# Patient Record
Sex: Female | Born: 1935 | Race: White | Hispanic: No | Marital: Married | Smoking: Never smoker
Health system: Southern US, Community
[De-identification: ages and names within clinical notes are randomized; demographics above are authoritative.]

## PROBLEM LIST (undated history)

## (undated) DIAGNOSIS — I639 Cerebral infarction, unspecified: Secondary | ICD-10-CM

## (undated) DIAGNOSIS — F329 Major depressive disorder, single episode, unspecified: Secondary | ICD-10-CM

## (undated) DIAGNOSIS — I10 Essential (primary) hypertension: Secondary | ICD-10-CM

## (undated) DIAGNOSIS — C801 Malignant (primary) neoplasm, unspecified: Secondary | ICD-10-CM

## (undated) DIAGNOSIS — F32A Depression, unspecified: Secondary | ICD-10-CM

## (undated) DIAGNOSIS — F419 Anxiety disorder, unspecified: Secondary | ICD-10-CM

## (undated) DIAGNOSIS — F0390 Unspecified dementia without behavioral disturbance: Secondary | ICD-10-CM

## (undated) DIAGNOSIS — G459 Transient cerebral ischemic attack, unspecified: Secondary | ICD-10-CM

## (undated) DIAGNOSIS — E785 Hyperlipidemia, unspecified: Secondary | ICD-10-CM

## (undated) DIAGNOSIS — M810 Age-related osteoporosis without current pathological fracture: Secondary | ICD-10-CM

## (undated) DIAGNOSIS — W19XXXA Unspecified fall, initial encounter: Secondary | ICD-10-CM

## (undated) HISTORY — PX: ABDOMINAL HYSTERECTOMY: SHX81

---

## 2007-07-08 ENCOUNTER — Inpatient Hospital Stay (HOSPITAL_COMMUNITY): Admission: RE | Admit: 2007-07-08 | Discharge: 2007-07-10 | Payer: Self-pay | Admitting: Neurosurgery

## 2011-04-24 NOTE — Op Note (Signed)
NAMEKRISTEE, ANGUS                ACCOUNT NO.:  [id number]   MEDICAL RECORD NO.:  [id number]          PATIENT TYPE:  INP   LOCATION:  3172                         FACILITY:  [facility]   PHYSICIAN:  Hilda Lias, M.D.   DATE OF BIRTH:  [birth year]   DATE OF PROCEDURE:  07/08/2007  DATE OF DISCHARGE:                               OPERATIVE REPORT   PREOPERATIVE DIAGNOSIS:  Left L4-5 synovial cyst with an L5  radiculopathy.   POSTOPERATIVE DIAGNOSIS:  Left L4-5 synovial cyst with an L5  radiculopathy.   PROCEDURE:  Left L4-5 laminotomy, removal of synovial cyst.  Decompression of the L4 and L5 nerve root.  Microscope.   SURGEON:  Dr. Hilda Lias.   ASSISTANT:  Dr. Hewitt Shorts.   CLINICAL HISTORY:  The patient was seen by me in the office because of  back pain progressing to the left leg with some weakness of the left  foot.  X-rays showed a synovial cyst compromising the thecal sac at the  S1 as well as the L5 nerve root.  The patient want to proceed with  surgery and the risks were explained in the history and physical.   PROCEDURE:  The patient was taken to the OR and he was positioned in a  prone manner.  The back was cleaned with DuraPrep.  A midline incision  from L4-5 was made and muscles were retracted on the left side.  X-rays  showed we were indeed at the level of L4-5.  Then with the microscope we  removed the calcified yellow ligament.  We found that the dural sac was  displaced with synovial cyst, pushing down into the L5 nerve root.  We  went above the yellow ligament and we found normal dura mater and went  below the level of L5.  Then with our dissection, removing the disk  synovial cyst away from the dural sac as well as the L5 nerve root.  At  the end we had good decompression.  The area was irrigated.  The patient  has a good foraminotomy with plenty of room for the L4 and L5 nerve  root.  Then Fentanyl and Depo-Medrol were left in the epidural space  and  the wound was closed with Vicryl and Steri-Strips.           ______________________________  Hilda Lias, M.D.     EB/MEDQ  D:  07/08/2007  T:  07/09/2007  Job:  [id number]

## 2011-09-24 LAB — URINALYSIS, ROUTINE W REFLEX MICROSCOPIC
Glucose, UA: NEGATIVE
Hgb urine dipstick: NEGATIVE
Ketones, ur: 15 — AB
Protein, ur: NEGATIVE

## 2011-09-24 LAB — CBC
Hemoglobin: 13.7
Platelets: 310
RDW: 12.6

## 2011-09-24 LAB — COMPREHENSIVE METABOLIC PANEL
ALT: 27
AST: 20
Albumin: 3.9
Alkaline Phosphatase: 48
GFR calc Af Amer: >60
Glucose, Bld: 100 — ABNORMAL HIGH
Potassium: 4
Sodium: 138
Total Protein: 6.3

## 2014-08-10 DIAGNOSIS — I639 Cerebral infarction, unspecified: Secondary | ICD-10-CM

## 2014-08-10 HISTORY — DX: Cerebral infarction, unspecified: I63.9

## 2015-11-02 ENCOUNTER — Other Ambulatory Visit (HOSPITAL_COMMUNITY): Payer: Medicare Other

## 2015-11-02 ENCOUNTER — Inpatient Hospital Stay (HOSPITAL_COMMUNITY)
Admission: EM | Admit: 2015-11-02 | Discharge: 2015-11-04 | DRG: 065 | Disposition: A | Discharge status: Home Health | Payer: Medicare Other | Attending: Oncology | Admitting: Oncology

## 2015-11-02 ENCOUNTER — Encounter (HOSPITAL_COMMUNITY): Payer: Self-pay | Admitting: Emergency Medicine

## 2015-11-02 ENCOUNTER — Emergency Department (HOSPITAL_COMMUNITY): Payer: Medicare Other

## 2015-11-02 ENCOUNTER — Inpatient Hospital Stay (HOSPITAL_COMMUNITY): Payer: Medicare Other

## 2015-11-02 DIAGNOSIS — R29810 Facial weakness: Secondary | ICD-10-CM | POA: Diagnosis present

## 2015-11-02 DIAGNOSIS — I639 Cerebral infarction, unspecified: Secondary | ICD-10-CM | POA: Diagnosis not present

## 2015-11-02 DIAGNOSIS — K5900 Constipation, unspecified: Secondary | ICD-10-CM | POA: Diagnosis not present

## 2015-11-02 DIAGNOSIS — Z8673 Personal history of transient ischemic attack (TIA), and cerebral infarction without residual deficits: Secondary | ICD-10-CM | POA: Insufficient documentation

## 2015-11-02 DIAGNOSIS — E785 Hyperlipidemia, unspecified: Secondary | ICD-10-CM | POA: Insufficient documentation

## 2015-11-02 DIAGNOSIS — N179 Acute kidney failure, unspecified: Secondary | ICD-10-CM | POA: Diagnosis present

## 2015-11-02 DIAGNOSIS — G8194 Hemiplegia, unspecified affecting left nondominant side: Secondary | ICD-10-CM | POA: Diagnosis present

## 2015-11-02 DIAGNOSIS — Z853 Personal history of malignant neoplasm of breast: Secondary | ICD-10-CM | POA: Diagnosis not present

## 2015-11-02 DIAGNOSIS — I63431 Cerebral infarction due to embolism of right posterior cerebral artery: Principal | ICD-10-CM | POA: Diagnosis present

## 2015-11-02 DIAGNOSIS — R29702 NIHSS score 2: Secondary | ICD-10-CM | POA: Diagnosis present

## 2015-11-02 DIAGNOSIS — G459 Transient cerebral ischemic attack, unspecified: Secondary | ICD-10-CM | POA: Diagnosis present

## 2015-11-02 DIAGNOSIS — R4701 Aphasia: Secondary | ICD-10-CM | POA: Diagnosis present

## 2015-11-02 DIAGNOSIS — R4781 Slurred speech: Secondary | ICD-10-CM | POA: Diagnosis present

## 2015-11-02 DIAGNOSIS — Z7982 Long term (current) use of aspirin: Secondary | ICD-10-CM

## 2015-11-02 DIAGNOSIS — Z90710 Acquired absence of both cervix and uterus: Secondary | ICD-10-CM

## 2015-11-02 DIAGNOSIS — I10 Essential (primary) hypertension: Secondary | ICD-10-CM | POA: Diagnosis present

## 2015-11-02 HISTORY — DX: Cerebral infarction, unspecified: I63.9

## 2015-11-02 HISTORY — DX: Malignant (primary) neoplasm, unspecified: C80.1

## 2015-11-02 LAB — COMPREHENSIVE METABOLIC PANEL
ALBUMIN: 3.6 g/dL (ref 3.5–5.0)
ALT: 22 U/L (ref 14–54)
ANION GAP: 10 (ref 5–15)
AST: 21 U/L (ref 15–41)
Alkaline Phosphatase: 55 U/L (ref 38–126)
BILIRUBIN TOTAL: 0.6 mg/dL (ref 0.3–1.2)
BUN: 29 mg/dL — ABNORMAL HIGH (ref 6–20)
CO2: 23 mmol/L (ref 22–32)
Calcium: 9.2 mg/dL (ref 8.9–10.3)
Chloride: 105 mmol/L (ref 101–111)
Creatinine, Ser: 1.44 mg/dL — ABNORMAL HIGH (ref 0.44–1.00)
GFR calc Af Amer: 37 mL/min — ABNORMAL LOW (ref >60)
GFR calc non Af Amer: 32 mL/min — ABNORMAL LOW (ref >60)
GLUCOSE: 141 mg/dL — AB (ref 65–99)
POTASSIUM: 4.3 mmol/L (ref 3.5–5.1)
SODIUM: 138 mmol/L (ref 135–145)
TOTAL PROTEIN: 6.3 g/dL — AB (ref 6.5–8.1)

## 2015-11-02 LAB — CBC
HCT: 36.2 % (ref 36.0–46.0)
Hemoglobin: 12.1 g/dL (ref 12.0–15.0)
MCH: 31.9 pg (ref 26.0–34.0)
MCHC: 33.4 g/dL (ref 30.0–36.0)
MCV: 95.5 fL (ref 78.0–100.0)
PLATELETS: 163 10*3/uL (ref 150–400)
RBC: 3.79 MIL/uL — ABNORMAL LOW (ref 3.87–5.11)
RDW: 12.7 % (ref 11.5–15.5)
WBC: 6.7 10*3/uL (ref 4.0–10.5)

## 2015-11-02 LAB — DIFFERENTIAL
Basophils Absolute: 0 10*3/uL (ref 0.0–0.1)
Basophils Relative: 0 %
EOS ABS: 0 10*3/uL (ref 0.0–0.7)
EOS PCT: 0 %
Lymphocytes Relative: 26 %
Lymphs Abs: 1.7 10*3/uL (ref 0.7–4.0)
Monocytes Absolute: 0.7 10*3/uL (ref 0.1–1.0)
Monocytes Relative: 10 %
NEUTROS PCT: 64 %
Neutro Abs: 4.3 10*3/uL (ref 1.7–7.7)

## 2015-11-02 LAB — I-STAT TROPONIN, ED: Troponin i, poc: 0.32 ng/mL (ref 0.00–0.08)

## 2015-11-02 LAB — URINALYSIS, ROUTINE W REFLEX MICROSCOPIC
Bilirubin Urine: NEGATIVE
GLUCOSE, UA: NEGATIVE mg/dL
HGB URINE DIPSTICK: NEGATIVE
Ketones, ur: NEGATIVE mg/dL
Leukocytes, UA: NEGATIVE
Nitrite: NEGATIVE
PH: 6 (ref 5.0–8.0)
Protein, ur: NEGATIVE mg/dL
SPECIFIC GRAVITY, URINE: 1.01 (ref 1.005–1.030)

## 2015-11-02 LAB — I-STAT CHEM 8, ED
BUN: 30 mg/dL — ABNORMAL HIGH (ref 6–20)
Calcium, Ion: 1.18 mmol/L (ref 1.13–1.30)
Chloride: 101 mmol/L (ref 101–111)
Creatinine, Ser: 1.4 mg/dL — ABNORMAL HIGH (ref 0.44–1.00)
Glucose, Bld: 139 mg/dL — ABNORMAL HIGH (ref 65–99)
HEMATOCRIT: 37 % (ref 36.0–46.0)
HEMOGLOBIN: 12.6 g/dL (ref 12.0–15.0)
Potassium: 4.2 mmol/L (ref 3.5–5.1)
SODIUM: 137 mmol/L (ref 135–145)
TCO2: 24 mmol/L (ref 0–100)

## 2015-11-02 LAB — ETHANOL: Alcohol, Ethyl (B): <5 mg/dL (ref <5)

## 2015-11-02 LAB — RAPID URINE DRUG SCREEN, HOSP PERFORMED
AMPHETAMINES: NOT DETECTED
Barbiturates: NOT DETECTED
Benzodiazepines: NOT DETECTED
Cocaine: NOT DETECTED
OPIATES: NOT DETECTED
Tetrahydrocannabinol: NOT DETECTED

## 2015-11-02 LAB — PROTIME-INR
INR: 1.06 (ref 0.00–1.49)
PROTHROMBIN TIME: 14 s (ref 11.6–15.2)

## 2015-11-02 LAB — APTT: aPTT: 28 seconds (ref 24–37)

## 2015-11-02 LAB — TROPONIN I
TROPONIN I: 0.55 ng/mL — AB (ref <0.031)
TROPONIN I: 0.48 ng/mL — AB (ref <0.031)
Troponin I: 0.53 ng/mL (ref <0.031)

## 2015-11-02 MED ORDER — BUSPIRONE HCL 10 MG PO TABS
20.0000 mg | ORAL_TABLET | Freq: Two times a day (BID) | ORAL | Status: DC
  Administered 2015-11-02 – 2015-11-04 (×5): 20 mg via ORAL
  Filled 2015-11-02 (×5): qty 2

## 2015-11-02 MED ORDER — SODIUM CHLORIDE 0.9 % IJ SOLN
3.0000 mL | Freq: Two times a day (BID) | INTRAMUSCULAR | Status: DC
  Administered 2015-11-03 (×2): 3 mL via INTRAVENOUS

## 2015-11-02 MED ORDER — PRAVASTATIN SODIUM 20 MG PO TABS
20.0000 mg | ORAL_TABLET | Freq: Every day | ORAL | Status: DC
  Administered 2015-11-02: 20 mg via ORAL
  Filled 2015-11-02: qty 1

## 2015-11-02 MED ORDER — ASPIRIN EC 81 MG PO TBEC
81.0000 mg | DELAYED_RELEASE_TABLET | Freq: Every day | ORAL | Status: DC
  Administered 2015-11-02 – 2015-11-04 (×3): 81 mg via ORAL
  Filled 2015-11-02 (×3): qty 1

## 2015-11-02 MED ORDER — SODIUM CHLORIDE 0.9 % IV SOLN
INTRAVENOUS | Status: AC
  Administered 2015-11-02: 16:00:00 via INTRAVENOUS

## 2015-11-02 MED ORDER — ARIPIPRAZOLE 10 MG PO TABS
5.0000 mg | ORAL_TABLET | Freq: Every day | ORAL | Status: DC
  Administered 2015-11-03 – 2015-11-04 (×2): 5 mg via ORAL
  Filled 2015-11-02 (×2): qty 1

## 2015-11-02 MED ORDER — VENLAFAXINE HCL ER 75 MG PO CP24
150.0000 mg | ORAL_CAPSULE | Freq: Every day | ORAL | Status: DC
  Administered 2015-11-02 – 2015-11-04 (×3): 150 mg via ORAL
  Filled 2015-11-02 (×3): qty 2

## 2015-11-02 MED ORDER — HEPARIN SODIUM (PORCINE) 5000 UNIT/ML IJ SOLN
5000.0000 [IU] | Freq: Three times a day (TID) | INTRAMUSCULAR | Status: DC
Start: 2015-11-02 — End: 2015-11-04
  Administered 2015-11-02 – 2015-11-04 (×6): 5000 [IU] via SUBCUTANEOUS
  Filled 2015-11-02 (×6): qty 1

## 2015-11-02 NOTE — ED Notes (Signed)
Pt attempted bedpan twice. Tolerated in and out cath well.

## 2015-11-02 NOTE — Evaluation (Signed)
Physical Therapy Evaluation Patient Details Name: Nancy Rowland MRN: [id number] DOB: [birth year] Today's Date: 11/02/2015   History of Present Illness  81 y.o. female with a history of hypertension and hyperlipidemia as well as stroke and TIA, brought to the emergency room and code stroke status following the acute onset of speech changes and right-sided weakness. Speech was described as slurred and unintelligible. She had right facial droop and also had right extremity weakness.  Clinical Impression  Pt admitted with above diagnosis. Pt currently with functional limitations due to the deficits listed below (see PT Problem List). PTA Ms. Matthias is alone at home for ~4 hrs/day, otherwise has supervision from aide and daughter, needing assist for dressing.  She was at supervision level of ambulation w/o AD.  However, she demonstrates a festinating gait, shuffling her feet and is at increased risk for falling, requiring min assist at times to steady. Pt will need to have 24/7 assist if she is to return home safely, had discussion about these needs w/ pt and pt's daughter who were very receptive and eager to discuss w/ CM and/or SW.  Pt will benefit from skilled PT to increase their independence and safety with mobility to allow discharge to the venue listed below.      Follow Up Recommendations Home health PT;Supervision/Assistance - 24 hour    Equipment Recommendations  None recommended by PT    Recommendations for Other Services       Precautions / Restrictions Precautions Precautions: Fall Precaution Comments: essential tremors at baseline Restrictions Weight Bearing Restrictions: No      Mobility  Bed Mobility Overal bed mobility: Modified Independent Bed Mobility: Supine to Sit     Supine to sit: Supervision     General bed mobility comments: increased time, elevated head of bed  Transfers Overall transfer level: Needs assistance Equipment used: 1 person hand held  assist;None Transfers: Sit to/from Stand Sit to Stand: Min assist         General transfer comment: Bil LE bracing against bed.  1 person HHA provided during initial sit>stand to assist pt stabilizing.  Close min guard assist provided during stand>sit after ambulation.  Ambulation/Gait Ambulation/Gait assistance: Min assist Ambulation Distance (Feet): 80 Feet Assistive device: 1 person hand held assist;None Gait Pattern/deviations: Step-through pattern;Shuffle;Antalgic;Festinating   Gait velocity interpretation: <1.8 ft/sec, indicative of risk for recurrent falls General Gait Details: Slow festinating gait which daughters report is slower than pt's baseline.  Min assist at times to steady.  Stairs            Wheelchair Mobility    Modified Rankin (Stroke Patients Only) Modified Rankin (Stroke Patients Only) Pre-Morbid Rankin Score: Moderately severe disability Modified Rankin: Moderately severe disability     Balance Overall balance assessment: Needs assistance Sitting-balance support: Feet supported Sitting balance-Leahy Scale: Good     Standing balance support: During functional activity Standing balance-Leahy Scale: Fair Standing balance comment: Min assist to steady at times                             Pertinent Vitals/Pain Pain Assessment: No/denies pain    Home Living Family/patient expects to be discharged to:: Private residence Living Arrangements: Alone Available Help at Discharge: Family;Available PRN/intermittently;Personal care attendant (almost 24/7, see additional comments below) Type of Home: House Home Access: Stairs to enter Entrance Stairs-Rails: None Entrance Stairs-Number of Steps: 1 Home Layout: One level Home Equipment: Walker - 4 wheels Additional Comments:  Pt's two sister present and able to answer questions regarding pt's PLOF and home living situation    Prior Function Level of Independence: Needs assistance   Gait /  Transfers Assistance Needed: no use of AD PTA per pt and pt's daughters  ADL's / Homemaking Assistance Needed: Aide and daughter assist w/ dressing, pt Ind w/ bathing  Comments: Pt has an aide that comes 4x/wk from ~10am-5pm.  The remaining 3 days of the week her daughter stays with her.  Pt's daughter reports the pt is alone for a few hours in the morning and for 1-2 hours at night until the daugther can get there after work to spend the night with pt.     Hand Dominance   Dominant Hand: Right    Extremity/Trunk Assessment   Upper Extremity Assessment: Defer to OT evaluation           Lower Extremity Assessment: RLE deficits/detail RLE Deficits / Details: grossly 4/5    Cervical / Trunk Assessment: Normal  Communication   Communication: Expressive difficulties;HOH (has hearing aids but does not have them with her)  Cognition Arousal/Alertness: Awake/alert Behavior During Therapy: WFL for tasks assessed/performed Overall Cognitive Status: History of cognitive impairments - at baseline Area of Impairment: Orientation;Attention;Memory;Following commands;Safety/judgement;Awareness;Problem solving Orientation Level: Disoriented to;Place (Knows she is in a hospital but cannot name which one) Current Attention Level: Selective Memory: Decreased short-term memory Following Commands: Follows multi-step commands with increased time;Follows multi-step commands consistently Safety/Judgement: Decreased awareness of safety;Decreased awareness of deficits Awareness: Emergent Problem Solving: Slow processing;Requires verbal cues;Requires tactile cues      General Comments General comments (skin integrity, edema, etc.): Discussed w/ pt and family that pt will need 24/7 assist if she is to safely return home.  Pt and family very receptive of this and look forward to speaking w/ CM and/or SW about this.      Exercises        Assessment/Plan    PT Assessment Patient needs continued PT  services  PT Diagnosis Hemiplegia dominant side;Difficulty walking   PT Problem List Decreased strength;Decreased range of motion;Decreased activity tolerance;Decreased balance;Decreased mobility;Decreased cognition;Decreased knowledge of use of DME;Decreased safety awareness;Decreased knowledge of precautions  PT Treatment Interventions DME instruction;Gait training;Stair training;Functional mobility training;Therapeutic activities;Therapeutic exercise;Balance training;Neuromuscular re-education;Cognitive remediation;Patient/family education   PT Goals (Current goals can be found in the Care Plan section) Acute Rehab PT Goals Patient Stated Goal: to decide if it is best to go home or elsewhere for 24/7 assist PT Goal Formulation: With patient/family Time For Goal Achievement: 11/16/15 Potential to Achieve Goals: Good    Frequency Min 4X/week   Barriers to discharge Inaccessible home environment;Decreased caregiver support does not have 24/7 assist currently    Co-evaluation               End of Session Equipment Utilized During Treatment: Gait belt Activity Tolerance: Patient tolerated treatment well Patient left: in bed;with call bell/phone within reach;with family/visitor present;Other (comment) (w/ MD in room. Pt sitting EOB, daughters to stay w/ pt) Nurse Communication: Mobility status;Precautions         Time: [id number] PT Time Calculation (min) (ACUTE ONLY): 19 min   Charges:   PT Evaluation $Initial PT Evaluation Tier I: 1 Procedure     PT G Codes:       Joslyn Hy PT, DPT [phone] Pager: [phone] 11/02/2015, 4:24 PM

## 2015-11-02 NOTE — ED Notes (Signed)
Attempted to call report x2

## 2015-11-02 NOTE — Evaluation (Signed)
Clinical/Bedside Swallow Evaluation Patient Details  Name: Nancy Rowland MRN: [id number] Date of Birth: 12-01-1929  Today's Date: 11/02/2015 Time: SLP Start Time (ACUTE ONLY): 1342 SLP Stop Time (ACUTE ONLY): 1358 SLP Time Calculation (min) (ACUTE ONLY): 16 min  Past Medical History:  Past Medical History  Diagnosis Date  . Cancer ([place])   . Stroke [facility]) 08/2014   Past Surgical History:  Past Surgical History  Procedure Laterality Date  . Abdominal hysterectomy     HPI:  81 y.o. female w/ PMHx of HTN, HLD, previous CVA/TIA, and ?endometrial CA s/p hysterectomy years ago, admitted for TIA/stroke workup.   Assessment / Plan / Recommendation Clinical Impression  Pt presents with functional oropharyngeal swallow marked by adequate mastication, brisk swallow response,  intermittent, mild throat clear with liquids.  Recommend regular diet, thin liquids - will f/u x1 to ensure safety with diet; follow for speech language evaluation next date.      Aspiration Risk  Mild aspiration risk    Diet Recommendation   regular, thin liquids  Medication Administration: Whole meds with puree    Other  Recommendations Oral Care Recommendations: Oral care BID   Follow up Recommendations  None    Frequency and Duration min 1 x/week  1 week       Swallow Study   General Date of Onset: 11/02/15 HPI: 81 y.o. female w/ PMHx of HTN, HLD, previous CVA/TIA, and ?endometrial CA s/p hysterectomy years ago, admitted for TIA/stroke workup. Type of Study: Bedside Swallow Evaluation Previous Swallow Assessment: none per records Diet Prior to this Study: NPO Temperature Spikes Noted: No Respiratory Status: Room air History of Recent Intubation: No Behavior/Cognition: Alert;Cooperative Oral Cavity Assessment: Within Functional Limits Oral Care Completed by SLP: No Oral Cavity - Dentition: Adequate natural dentition Vision: Functional for self-feeding Self-Feeding Abilities: Able to feed  self Patient Positioning: Upright in bed Baseline Vocal Quality: Hoarse Volitional Cough: Strong Volitional Swallow: Able to elicit    Oral/Motor/Sensory Function Overall Oral Motor/Sensory Function: Within functional limits   Ice Chips Ice chips: Within functional limits   Thin Liquid Thin Liquid: Within functional limits (throat-clear x2 post swallow) Presentation: Cup    Nectar Thick Nectar Thick Liquid: Not tested   Honey Thick Honey Thick Liquid: Not tested   Puree Puree: Within functional limits   Solid Solid: Within functional limits       Juan Quam Laurice 11/02/2015,2:02 PM   Estill Bamberg L. Tivis Ringer, [place] CCC/SLP Pager [phone]

## 2015-11-02 NOTE — Progress Notes (Signed)
Patient arrived to [id number]. Patient is oriented x3, disoriented to time. Patient's NIHHS 2 for facial palsy and aphagia. No skin issues noted, per ED RN, patient failed stroke swallow screen, order for slp bedside swallow evaluation placed. Patient oriented to room, staff, unit. Safety measures in place including bed alarm. Family at bedside, will continue to monitor closely.

## 2015-11-02 NOTE — Progress Notes (Signed)
Occupational Therapy Evaluation Patient Details Name: Nancy Rowland MRN: [id number] DOB: [birth year] Today's Date: 11/02/2015    History of Present Illness 81 y.o. female with a history of hypertension and hyperlipidemia as well as stroke and TIA, brought to the emergency room and code stroke status following the acute onset of speech changes and right-sided weakness. Speech was described as slurred and unintelligible. She had right facial droop and also had right extremity weakness.CVA work up underway   Clinical Impression   PTA, pt lived alone and had a caregiver 5 days/wk to assist with IADL tasks. Pt states daughter checks on her daily. Family not present during eval. Pt demonstrates deficits listed below and recommend pt have initial 24/7 S with [place] if D/C plan is home. Will follow acutely to maximize functional level of independence with ADL and facilitate safe D/C home.     Follow Up Recommendations  Home health OT;Supervision/Assistance - 24 hour    Equipment Recommendations  3 in 1 bedside comode;Tub/shower bench    Recommendations for Other Services       Precautions / Restrictions Precautions Precautions: Fall Restrictions Weight Bearing Restrictions: No      Mobility Bed Mobility Overal bed mobility: Needs Assistance Bed Mobility: Supine to Sit     Supine to sit: Supervision     General bed mobility comments: increased time, elevated head of bed  Transfers Overall transfer level: Needs assistance   Transfers: Sit to/from Stand Sit to Stand: Min assist         General transfer comment: posterior bias with sit - stand. BLE bracing against bed. 2-3 trials of sit -stand before pt able to maintain upright posture. Same for toilet. After standing, pt able to ambulate with min guard.     Balance Overall balance assessment: Needs assistance Sitting-balance support: Feet supported Sitting balance-Leahy Scale: Good     Standing balance support: During  functional activity Standing balance-Leahy Scale: Fair                              ADL Overall ADL's : Needs assistance/impaired     Grooming: Set up;Supervision/safety;Standing   Upper Body Bathing: Supervision/ safety;Set up;Standing   Lower Body Bathing: Minimal assistance Lower Body Bathing Details (indicate cue type and reason): difficulty with reaching feet Upper Body Dressing : Supervision/safety;Set up;Sitting   Lower Body Dressing: Minimal assistance;Sit to/from stand   Toilet Transfer: Min guard;Ambulation   Toileting- Clothing Manipulation and Hygiene: Min guard;Sit to/from stand       Functional mobility during ADLs: Min guard (Initially pt with posterior lean/sway  onto bed. ) General ADL Comments: Family not present during evaluation. Pt appears to require increased time for processing adn unsteady at times with mobility. When going to sit iin recliner, pt unsafely sat on corner of chair, increasing risk of falling. Pt states she has had several falls at home occuring in the bedroom, but she does not recall how they happened  Pt educated regarding need to use call bell to call for assistance.     Vision Vision Assessment?:  (will further assess) Additional Comments: pt reports no change from baseline, but that she needs to get her eyes checked   Perception     Praxis Praxis Praxis tested?: Within functional limits    Pertinent Vitals/Pain Pain Assessment: No/denies pain     Hand Dominance Right   Extremity/Trunk Assessment Upper Extremity Assessment Upper Extremity Assessment: Generalized weakness (c/o  R shoulder pain - PTA)   Lower Extremity Assessment Lower Extremity Assessment: Defer to PT evaluation   Cervical / Trunk Assessment Cervical / Trunk Assessment: Normal   Communication Communication Communication: Expressive difficulties;HOH   Cognition Arousal/Alertness: Awake/alert Behavior During Therapy: WFL for tasks  assessed/performed Overall Cognitive Status: No family/caregiver present to determine baseline cognitive functioning  Will further assess. Very HOH                     General Comments   Pt states she "just doesn't feel right"    Exercises       Shoulder Instructions      Home Living Family/patient expects to be discharged to:: Private residence Living Arrangements: Alone Available Help at Discharge: Family;Available PRN/intermittently Type of Home: House Home Access: Stairs to enter [employer] of Steps: 4   Home Layout: One level     Bathroom Shower/Tub: Tub/shower unit Shower/tub characteristics: Architectural technologist: Standard Bathroom Accessibility: Yes How Accessible: Accessible via walker Home Equipment: None          Prior Functioning/Environment Level of Independence: Independent        Comments: Pt states she has a "woman that comes 5 days/wk to help with some cooking and cleaning"    OT Diagnosis: Generalized weakness;Cognitive deficits   OT Problem List: Decreased strength;Decreased activity tolerance;Impaired balance (sitting and/or standing);Decreased cognition;Decreased safety awareness   OT Treatment/Interventions: Self-care/ADL training;Therapeutic exercise;DME and/or AE instruction;Therapeutic activities;Cognitive remediation/compensation;Patient/family education;Balance training    OT Goals(Current goals can be found in the care plan section) Acute Rehab OT Goals Patient Stated Goal: to go home OT Goal Formulation: With patient Time For Goal Achievement: 11/16/15 Potential to Achieve Goals: Good ADL Goals Pt Will Perform Lower Body Bathing: with set-up;sit to/from stand;with supervision Pt Will Perform Lower Body Dressing: with set-up;with supervision;sit to/from stand Pt Will Transfer to Toilet: with supervision;bedside commode;ambulating Pt Will Perform Toileting - Clothing Manipulation and hygiene: with modified  independence;sit to/from stand Pt Will Perform Tub/Shower Transfer: with supervision;with caregiver independent in assisting;ambulating;tub bench  OT Frequency: Min 2X/week   Barriers to D/C:    unsure of caregiver support       Co-evaluation              End of Session Equipment Utilized During Treatment: Gait belt Nurse Communication: Mobility status  Activity Tolerance: Patient tolerated treatment well Patient left: in chair;with call bell/phone within reach;with chair alarm set   Time: [id number] OT Time Calculation (min): 24 min Charges:  OT General Charges $OT Visit: 1 Procedure OT Evaluation $Initial OT Evaluation Tier I: 1 Procedure OT Treatments $Self Care/Home Management : 8-22 mins G-Codes:    Lekita Kerekes,HILLARY November 28, 2015, 3:07 PM   [facility], OTR/L  [phone] 2015-11-28

## 2015-11-02 NOTE — H&P (Signed)
Date: 11/02/2015               Patient Name:  Nancy Rowland MRN: [id number]  DOB: [birth year] Age / Sex: 81 y.o., female   PCP: No primary care provider on file.         Medical Service: Internal Medicine Teaching Service         Attending Physician: Dr. Annia Belt, MD    First Contact: Dr. Marlowe Sax Pager: [phone]  Second Contact: Dr. Genene Churn Pager: [phone]       After Hours (After 5p/  First Contact Pager: [phone]  weekends / holidays): Second Contact Pager: [phone]   Chief Complaint: Right-sided weakness, speech difficulty  History of Present Illness: Nancy Rowland is a 81 y.o. female w/ PMHx of HTN, HLD, previous CVA/TIA, and ?endometrial CA s/p hysterectomy years ago presents to the ED as a code stroke for acute onset of speech difficulty and right-sided weakness. Per the patient, she was having word finding difficulty and was feeling very unsteady on her feet. She otherwise couldn't pinpoint any other issues she was having but states she knew something wasn't right. Per chart review, she was having slurred speech, comprehension issues, right facial droop, and right-sided weakness. The patient apparently lives alone in a house and is looked after very closely by one of her two daughters. Patient was last known well this AM at 6:30. She has a previous history of stroke/TIA and has been taking ASA 81 mg daily for some time. CT performed in the ED shows no acute abnormalities. When examined in the ED, patient states she is feeling much better, does not feel she is having any weakness, but still feels like she is having some mild difficulty with her speech.   Patient also noted to have mild troponin elevation, however, she denies any symptoms of SOB, chest pain or discomfort, dizziness, lightheadedness, or palpitations. No previous cardiac issues.    Meds: No current facility-administered medications for this encounter.   Current Outpatient Prescriptions  Medication Sig  Dispense Refill  . ARIPiprazole (ABILIFY) 5 MG tablet Take 5 mg by mouth daily.    Marland Kitchen aspirin EC 81 MG tablet Take 81 mg by mouth daily.    . busPIRone (BUSPAR) 10 MG tablet Take 20 mg by mouth 2 (two) times daily.    Marland Kitchen desvenlafaxine (PRISTIQ) 100 MG 24 hr tablet Take 100 mg by mouth daily.    Marland Kitchen losartan (COZAAR) 25 MG tablet Take 25 mg by mouth daily.    . pravastatin (PRAVACHOL) 20 MG tablet Take 20 mg by mouth daily.      Allergies: Allergies as of 11/02/2015  . (No Known Allergies)   Past Medical History  Diagnosis Date  . Cancer ([place])   . Stroke [facility]) 08/2014   Past Surgical History  Procedure Laterality Date  . Abdominal hysterectomy     No family history on file. Social History   Social History  . Marital Status: Married    Spouse Name: N/A  . Number of Children: N/A  . Years of Education: N/A   Occupational History  . Not on file.   Social History Main Topics  . Smoking status: Not on file  . Smokeless tobacco: Not on file  . Alcohol Use: Not on file  . Drug Use: Not on file  . Sexual Activity: Not on file   Other Topics Concern  . Not on file   Social History Narrative  . No narrative  on file    Review of Systems:  General: Denies fever, diaphoresis, appetite change, and fatigue.  Respiratory: Denies SOB, cough, and wheezing.   Cardiovascular: Denies chest pain and palpitations.  Gastrointestinal: Denies nausea, vomiting, abdominal pain, and diarrhea Musculoskeletal: Denies myalgias, arthralgias, back pain, and gait problem.  Neurological: Positive for right-sided weakness, facial droop, and speech difficulty. Denies dizziness, syncope, lightheadedness, and headaches.  Psychiatric/Behavioral: Denies mood changes, sleep disturbance, and agitation.  Physical Exam: Blood pressure 139/77, pulse 97, temperature 98 F (36.7 C), temperature source Oral, resp. rate 18, height 5\' 6"  (1.676 m), weight 152 lb 8.9 oz (69.2 kg), SpO2 100 %.  General: Very  pleasant elderly white female, alert, cooperative, NAD. Mild stuttering of speech.  HEENT: PERRL, EOMI. Moist mucus membranes Neck: Full range of motion without pain, supple, no lymphadenopathy or carotid bruits Lungs: Clear to ascultation bilaterally, normal work of respiration, no wheezes, rales, rhonchi Heart: RRR, no murmurs, gallops, or rubs Abdomen: Soft, non-tender, non-distended, BS + Extremities: No cyanosis, clubbing, or edema Neurologic: Alert & oriented x2 (not oriented to year), cranial nerves II-XII intact, strength grossly intact, sensation intact to light touch. Some mild left-sided orbiting (left hand orbiting right hand), mild dysarthria.    Lab results: Basic Metabolic Panel:  Recent Labs  11/02/15 0823  NA 138  137  K 4.3  4.2  CL 105  101  CO2 23  GLUCOSE 141*  139*  BUN 29*  30*  CREATININE 1.44*  1.40*  CALCIUM 9.2   Liver Function Tests:  Recent Labs  11/02/15 0823  AST 21  ALT 22  ALKPHOS 55  BILITOT 0.6  PROT 6.3*  ALBUMIN 3.6   CBC:  Recent Labs  11/02/15 0823  WBC 6.7  NEUTROABS 4.3  HGB 12.1  12.6  HCT 36.2  37.0  MCV 95.5  PLT 163   Cardiac Enzymes:  Recent Labs  11/02/15 0815  TROPONINI 0.55*   Coagulation:  Recent Labs  11/02/15 0823  LABPROT 14.0  INR 1.06    Alcohol Level:  Recent Labs  11/02/15 0823  ETH <5    Imaging results:  Ct Head Wo Contrast  11/02/2015  CLINICAL DATA:  Code stroke.  Stroke symptoms. EXAM: CT HEAD WITHOUT CONTRAST TECHNIQUE: Contiguous axial images were obtained from the base of the skull through the vertex without intravenous contrast. COMPARISON:  Head CT dated 05/08/2015. FINDINGS: There is mild generalized brain atrophy with commensurate dilatation of the ventricles and sulci. Mild chronic small vessel ischemic changes again noted within the deep periventricular white matter regions bilaterally. There is an old infarct with associated encephalomalacia in the lower left  occipital lobe. There are additional old infarcts with associated encephalomalacia in the upper right parietal-occipital lobe and right cerebellum. Smaller ill-defined low-density area within the lower right occipital lobe (images 11 through 13 on axial series 201) is new compared to the previous head CT of 05/08/2015, most suggestive of additional subacute-to-chronic infarct. There is no convincing mass, hemorrhage, or other evidence of acute parenchymal abnormality. No extra-axial hemorrhage. No acute osseous abnormality. Paranasal sinuses are clear. Mastoid air cells are clear. IMPRESSION: 1. Old infarcts within the left occipital lobe, right upper parietal-occipital lobe, and right cerebellum. 2. Additional ill-defined low-density area within the lower right occipital lobe is new compared to the head CT of 05/08/2015 suggesting additional interval infarct. This is of uncertain age but favored to be subacute or chronic. 3. No convincing evidence of an acute intracranial abnormality. No hemorrhage.  Critical Value/emergent results were called by telephone at the time of interpretation on 11/02/2015 at 8:40 am to Dr. Tanna Furry , who verbally acknowledged these results. Electronically Signed   By: Franki Cabot M.D.   On: 11/02/2015 08:46    Other results: EKG: NSR, no acute ischemic changes.   Assessment & Plan by Problem: Ms. AMARIONNA HASTINGS is a 81 y.o. female w/ PMHx of HTN, HLD, previous CVA/TIA, and ?endometrial CA s/p hysterectomy years ago, admitted for TIA/stroke workup.  Recurrent TIA: Most likely related to small vessel disease. Patient with symptoms of right-sided weakness, right facial droop, slurred speech, and receptive aphasia at home. Brought to the ED as Code Stroke but patient's symptoms improved significantly. On exam, patient still notes some mild difficulty with her speech but otherwise denies any issues with weakness. Exam reveals some possible mild dysarthria, otherwise no significant  neurological abnormalities on exam. Mild memory deficits, however, this is more likely her baseline. No issues with comprehension, cognitive function generally intact. Patient has had a stroke workup in the past as she states about a year ago she had another "spell" similar to this. She has been taking ASA since that time. CT head on admission shows no significant acute findings.  -Admit to telemetry -MRI, MRA -ECHO -CA dopplers -Lipid panel, HbA1c -Hold Losartan for permissive HTN -PT, OT ,SLP -Stroke team to see in AM. Appreciate neuro recs -Continue ASA, will likely need to be changed to Plavix 75 mg daily on discharge.   Troponin Elevation: Troponin 0.55 on admission, EKG with no ischemic changes. Suspect this is related to CVA/TIA vs demand ischemia. Patient also with a mild AKI as well. She denies any SOB, chest pain, palpitations, dizziness, or lightheadedness. Could also be related to a cardioembolic event (?atrial fibrillation) which involved the coronaries and brain.  -Telemetry -Cycle troponins -Repeat EKG in AM -Continue ASA as above  AKI: Mild, although her baseline is not known.  -NS @ 100 cc/hr for 12 hours -BMP in AM  DVT/PE PPx: SCD's. Can start Heparin [place] if MRI shows no small hemorrhage.  Dispo: Disposition is deferred at this time, awaiting improvement of current medical problems. Anticipated discharge in approximately 1-2 day(s).   The patient does have a current PCP (No primary care provider on file.) and does not need an [facility] hospital follow-up appointment after discharge.  The patient does not have transportation limitations that hinder transportation to clinic appointments.  Signed: Corky Sox, MD 11/02/2015, 10:35 AM

## 2015-11-02 NOTE — ED Notes (Signed)
Per the daughter the patient takes several antianxiety medications every day.

## 2015-11-02 NOTE — Consult Note (Signed)
Admission H&P    Chief Complaint: Acute onset of speech difficulty and right-sided weakness.  HPI: Nancy Rowland is an 81 y.o. female with a history of hypertension and hyperlipidemia as well as stroke and TIA, brought to the emergency room and code stroke status following the acute onset of speech changes and right-sided weakness. Speech was described as slurred and unintelligible. She had right facial droop and also had right extremity weakness. She was last known well at 6:30 AM today. She's been taking aspirin 81 mg per day. CT scan of her head showed no acute intracranial abnormality. Old left occipital encephalomalacia as well as likely old right occipital infarction were noted. NIH stroke score was 2. Patient markedly improved in route to the emergency room.  LSN: 6:30 AM on 11/02/2015 tPA Given: No: Rapidly resolving deficits mRankin:  Past Medical History  Diagnosis Date  . Cancer ([place])   . Stroke [facility]) 08/2014    Past Surgical History  Procedure Laterality Date  . Abdominal hysterectomy      Family history obtained. Positive for heart disease but negative for stroke.  Social History:  has no tobacco, alcohol, and drug history on file.  Allergies: Not on File  Medications: Patient's preadmission medications were reviewed by me.  ROS: History obtained from patient's daughter.  General ROS: negative for - chills, fatigue, fever, night sweats, weight gain or weight loss Psychological ROS: Mild short-term memory difficulty Ophthalmic ROS: negative for - blurry vision, double vision, eye pain or loss of vision ENT ROS: negative for - epistaxis, nasal discharge, oral lesions, sore throat, tinnitus or vertigo Allergy and Immunology ROS: negative for - hives or itchy/watery eyes Hematological and Lymphatic ROS: negative for - bleeding problems, bruising or swollen lymph nodes Endocrine ROS: negative for - galactorrhea, hair pattern changes, polydipsia/polyuria or temperature  intolerance Respiratory ROS: negative for - cough, hemoptysis, shortness of breath or wheezing Cardiovascular ROS: negative for - chest pain, dyspnea on exertion, edema or irregular heartbeat Gastrointestinal ROS: negative for - abdominal pain, diarrhea, hematemesis, nausea/vomiting or stool incontinence Genito-Urinary ROS: negative for - dysuria, hematuria, incontinence or urinary frequency/urgency Musculoskeletal ROS: Shuffling type gait, but stable Neurological ROS: as noted in HPI Dermatological ROS: negative for rash and skin lesion changes  Physical Examination: Blood pressure 118/62, pulse 92, temperature 98 F (36.7 C), temperature source Oral, resp. rate 18, weight 69.2 kg (152 lb 8.9 oz), SpO2 100 %.  HEENT-  Normocephalic, no lesions, without obvious abnormality.  Normal external eye and conjunctiva.  Normal TM's bilaterally.  Normal auditory canals and external ears. Normal external nose, mucus membranes and septum.  Normal pharynx. Neck supple with no masses, nodes, nodules or enlargement. Cardiovascular - regular rate and rhythm, S1, S2 normal, no murmur, click, rub or gallop Lungs - chest clear, no wheezing, rales, normal symmetric air entry Abdomen - soft, non-tender; bowel sounds normal; no masses,  no organomegaly Extremities - no edema and no skin discoloration  Neurologic Examination: Mental Status: Alert, oriented, thought content appropriate.  Speech fluent without evidence of aphasia. Able to follow commands without difficulty. Cranial Nerves: II-Visual fields were normal. III/IV/VI-Pupils were equal and reacted normally to light. Extraocular movements were full and conjugate.    V/VII-no facial numbness and no facial weakness. VIII-normal. X-normal speech and symmetrical palatal movement. XI: trapezius strength/neck flexion strength normal bilaterally XII-midline tongue extension with normal strength. Motor: 5/5 bilaterally with normal tone and bulk Sensory:  Normal throughout. Deep Tendon Reflexes: 1+ and symmetric. Plantars: Mute  bilaterally Cerebellar: Normal finger-to-nose testing. Carotid auscultation: Normal  Results for orders placed or performed during the hospital encounter of 11/02/15 (from the past 48 hour(s))  I-stat troponin, ED (not at [facility], [facility])     Status: Abnormal   Collection Time: 11/02/15  8:21 AM  Result Value Ref Range   Troponin i, poc 0.32 (HH) 0.00 - 0.08 ng/mL   Comment NOTIFIED PHYSICIAN    Comment 3            Comment: Due to the release kinetics of cTnI, a negative result within the first hours of the onset of symptoms does not rule out myocardial infarction with certainty. If myocardial infarction is still suspected, repeat the test at appropriate intervals.   I-Stat Chem 8, ED  (not at [facility], [facility])     Status: Abnormal   Collection Time: 11/02/15  8:23 AM  Result Value Ref Range   Sodium 137 135 - 145 mmol/L   Potassium 4.2 3.5 - 5.1 mmol/L   Chloride 101 101 - 111 mmol/L   BUN 30 (H) 6 - 20 mg/dL   Creatinine, Ser 1.40 (H) 0.44 - 1.00 mg/dL   Glucose, Bld 139 (H) 65 - 99 mg/dL   Calcium, Ion 1.18 1.13 - 1.30 mmol/L   TCO2 24 0 - 100 mmol/L   Hemoglobin 12.6 12.0 - 15.0 g/dL   HCT 37.0 36.0 - 46.0 %  Protime-INR     Status: None   Collection Time: 11/02/15  8:23 AM  Result Value Ref Range   Prothrombin Time 14.0 11.6 - 15.2 seconds   INR 1.06 0.00 - 1.49  APTT     Status: None   Collection Time: 11/02/15  8:23 AM  Result Value Ref Range   aPTT 28 24 - 37 seconds  CBC     Status: Abnormal   Collection Time: 11/02/15  8:23 AM  Result Value Ref Range   WBC 6.7 4.0 - 10.5 K/uL   RBC 3.79 (L) 3.87 - 5.11 MIL/uL   Hemoglobin 12.1 12.0 - 15.0 g/dL   HCT 36.2 36.0 - 46.0 %   MCV 95.5 78.0 - 100.0 fL   MCH 31.9 26.0 - 34.0 pg   MCHC 33.4 30.0 - 36.0 g/dL   RDW 12.7 11.5 - 15.5 %   Platelets 163 150 - 400 K/uL  Differential     Status: None   Collection Time: 11/02/15  8:23 AM  Result Value Ref  Range   Neutrophils Relative % 64 %   Neutro Abs 4.3 1.7 - 7.7 K/uL   Lymphocytes Relative 26 %   Lymphs Abs 1.7 0.7 - 4.0 K/uL   Monocytes Relative 10 %   Monocytes Absolute 0.7 0.1 - 1.0 K/uL   Eosinophils Relative 0 %   Eosinophils Absolute 0.0 0.0 - 0.7 K/uL   Basophils Relative 0 %   Basophils Absolute 0.0 0.0 - 0.1 K/uL   No results found.  Assessment: 81 y.o. female with multiple risk factors for stroke as well as history of stroke and TIA, presenting with probable recurrent TIA. Acute left subcortical ischemic small vessel stroke cannot be ruled out at this point, however.  Stroke Risk Factors - hyperlipidemia and hypertension  Plan: 1. HgbA1c, fasting lipid panel 2. MRI, MRA  of the brain without contrast 3. PT consult, OT consult, Speech consult 4. Echocardiogram 5. Carotid dopplers 6. Prophylactic therapy-Antiplatelet med: Aspirin  7. Telemetry monitoring  C.R. Nicole Kindred, MD [facility] [phone]  11/02/2015, 8:48 AM

## 2015-11-02 NOTE — ED Notes (Signed)
Per [facility] patient started having difficulty speaking, right sided facial droop, and right sided weakness at 715 am today.  EMS states the patient was attempting to speak en route but was only making nonsensible sounds.  Patient normally lives at home alone with visits from home health nursing.  Patient was last seen normal at 630 AM today.  By the time EMS arrived at the hospital the patient's symptoms had resolved.  Patient is in no apparent distress at this time and is alert and oriented.

## 2015-11-02 NOTE — ED Notes (Signed)
Attempted to call report x 1  

## 2015-11-02 NOTE — ED Provider Notes (Signed)
CSN: [id number]     Arrival date & time 11/02/15  0815 History   First MD Initiated Contact with Patient 11/02/15 0818     Chief Complaint  Patient presents with  . Code Stroke      HPI  Patient presents for evaluation as a EMS "code stroke". Patient seen by myself at the bridge at 8:18 AM  Patient awakened and was reportedly "normal" at 6:30 this morning. At 6:15 she apparently noticed some difficulty ambulating, and speaking. EMS was summoned. Upon their arrival they state that her speech was "garbled and you could not understand words".  She improves and is able to verbally respond to simple questioning upon arrival. She is moving all 4 extremities. Has a symmetric smile.  Reported history of prior stroke with memory problems and gait difficulties.  Past Medical History  Diagnosis Date  . Cancer ([place])   . Stroke [facility]) 08/2014   Past Surgical History  Procedure Laterality Date  . Abdominal hysterectomy     No family history on file. Social History  Substance Use Topics  . Smoking status: None  . Smokeless tobacco: None  . Alcohol Use: None   OB History    No data available     Review of Systems    Allergies  Review of patient's allergies indicates no known allergies.  Home Medications   Prior to Admission medications   Medication Sig Start Date End Date Taking? Authorizing Provider  ARIPiprazole (ABILIFY) 5 MG tablet Take 5 mg by mouth daily.   Yes Historical Provider, MD  aspirin EC 81 MG tablet Take 81 mg by mouth daily.   Yes Historical Provider, MD  busPIRone (BUSPAR) 10 MG tablet Take 20 mg by mouth 2 (two) times daily.   Yes Historical Provider, MD  desvenlafaxine (PRISTIQ) 100 MG 24 hr tablet Take 100 mg by mouth daily.   Yes Historical Provider, MD  losartan (COZAAR) 25 MG tablet Take 25 mg by mouth daily.   Yes Historical Provider, MD  pravastatin (PRAVACHOL) 20 MG tablet Take 20 mg by mouth daily.   Yes Historical Provider, MD   BP 132/65 mmHg   Pulse 84  Temp(Src) 98 F (36.7 C) (Oral)  Resp 16  Ht 5\' 6"  (1.676 m)  Wt 152 lb 8.9 oz (69.2 kg)  BMI 24.64 kg/m2  SpO2 98% Physical Exam  ED Course  Procedures (including critical care time) Labs Review Labs Reviewed  CBC - Abnormal; Notable for the following:    RBC 3.79 (*)    All other components within normal limits  COMPREHENSIVE METABOLIC PANEL - Abnormal; Notable for the following:    Glucose, Bld 141 (*)    BUN 29 (*)    Creatinine, Ser 1.44 (*)    Total Protein 6.3 (*)    GFR calc non Af Amer 32 (*)    GFR calc Af Amer 37 (*)    All other components within normal limits  TROPONIN I - Abnormal; Notable for the following:    Troponin I 0.55 (*)    All other components within normal limits  TROPONIN I - Abnormal; Notable for the following:    Troponin I 0.53 (*)    All other components within normal limits  TROPONIN I - Abnormal; Notable for the following:    Troponin I 0.48 (*)    All other components within normal limits  BASIC METABOLIC PANEL - Abnormal; Notable for the following:    BUN 21 (*)  Creatinine, Ser 1.25 (*)    Calcium 8.5 (*)    GFR calc non Af Amer 38 (*)    GFR calc Af Amer 44 (*)    All other components within normal limits  I-STAT CHEM 8, ED - Abnormal; Notable for the following:    BUN 30 (*)    Creatinine, Ser 1.40 (*)    Glucose, Bld 139 (*)    All other components within normal limits  I-STAT TROPOININ, ED - Abnormal; Notable for the following:    Troponin i, poc 0.32 (*)    All other components within normal limits  ETHANOL  PROTIME-INR  APTT  DIFFERENTIAL  URINE RAPID DRUG SCREEN, HOSP PERFORMED  URINALYSIS, ROUTINE W REFLEX MICROSCOPIC (NOT AT [facility])  CBC  PROTIME-INR  LIPID PANEL  HEMOGLOBIN A1C    Imaging Review Ct Head Wo Contrast  11/02/2015  CLINICAL DATA:  Code stroke.  Stroke symptoms. EXAM: CT HEAD WITHOUT CONTRAST TECHNIQUE: Contiguous axial images were obtained from the base of the skull through the vertex  without intravenous contrast. COMPARISON:  Head CT dated 05/08/2015. FINDINGS: There is mild generalized brain atrophy with commensurate dilatation of the ventricles and sulci. Mild chronic small vessel ischemic changes again noted within the deep periventricular white matter regions bilaterally. There is an old infarct with associated encephalomalacia in the lower left occipital lobe. There are additional old infarcts with associated encephalomalacia in the upper right parietal-occipital lobe and right cerebellum. Smaller ill-defined low-density area within the lower right occipital lobe (images 11 through 13 on axial series 201) is new compared to the previous head CT of 05/08/2015, most suggestive of additional subacute-to-chronic infarct. There is no convincing mass, hemorrhage, or other evidence of acute parenchymal abnormality. No extra-axial hemorrhage. No acute osseous abnormality. Paranasal sinuses are clear. Mastoid air cells are clear. IMPRESSION: 1. Old infarcts within the left occipital lobe, right upper parietal-occipital lobe, and right cerebellum. 2. Additional ill-defined low-density area within the lower right occipital lobe is new compared to the head CT of 05/08/2015 suggesting additional interval infarct. This is of uncertain age but favored to be subacute or chronic. 3. No convincing evidence of an acute intracranial abnormality. No hemorrhage. Critical Value/emergent results were called by telephone at the time of interpretation on 11/02/2015 at 8:40 am to Dr. Tanna Furry , who verbally acknowledged these results. Electronically Signed   By: Franki Cabot M.D.   On: 11/02/2015 08:46   Mr Jodene Nam Head Wo Contrast  11/02/2015  CLINICAL DATA:  TIA. Acute onset speech difficulty and right-sided weakness. EXAM: MRI HEAD WITHOUT CONTRAST MRA HEAD WITHOUT CONTRAST TECHNIQUE: Multiplanar, multiecho pulse sequences of the brain and surrounding structures were obtained without intravenous contrast.  Angiographic images of the head were obtained using MRA technique without contrast. COMPARISON:  Head CT 11/02/2015 and MRI 08/11/2014 FINDINGS: MRI HEAD FINDINGS There is a moderate-sized, late subacute to chronic right cerebellar PICA territory infarct, new from the prior MRI. There are small acute infarcts involving the right hippocampus, right occipital lobe, and right thalamus with a punctate acute infarct also noted along the anteroinferior margin of the atrium of the right lateral ventricle. A punctate acute cortical infarct is also noted in the posterior left frontal lobe. There is a small right parietal cortical infarct which is new from the prior MRI but chronic. There is also a chronic medial left occipital lobe infarct, acute on the prior MRI. Areas of laminar necrosis/ blood breakdown products are noted associated with the cerebellar,  left occipital, and right parietal infarcts. There is no evidence of mass, midline shift, or extra-axial fluid collection. Mild global cerebral atrophy is noted. Orbits are unremarkable. Paranasal sinuses and mastoid air cells are clear. Major intracranial vascular flow voids are preserved. MRA HEAD FINDINGS Mildly motion degraded examination. The visualized distal vertebral arteries are patent with the left being dominant. PICA and SCA origins are patent although there is evidence of mild-to-moderate right and high-grade left proximal SCA stenoses. Basilar artery is patent without stenosis. Posterior communicating arteries are not clearly identified. There is a severe proximal left PCA stenosis near the P1 - P2 junction with irregularity and attenuation of the left PCA distal to this. Right P1 segment is widely patent. There is moderate narrowing of the proximal right P2 segment with attenuation and irregularity of the right PCA more distally. The internal carotid arteries are patent from skullbase to carotid termini with minimal left supraclinoid ICA narrowing. There is  a 1.5 mm inferiorly directed outpouching from the proximal right supraclinoid ICA. ACAs and MCAs are patent without evidence of significant proximal stenosis or sizable branch vessel occlusion. Mild MCA branch vessel irregularity is present bilaterally. IMPRESSION: 1. Acute posterior circulation infarcts involving the right hippocampus, right occipital lobe, and right thalamus. 2. Punctate acute cortical infarct in the posterior left frontal lobe. 3. Late subacute to chronic right cerebellar PICA territory infarct. 4. Small, chronic right parietal infarct, new from prior MRI. 5. Chronic left occipital infarct. 6. No large vessel occlusion. 7. Severe proximal left PCA stenosis. Moderate right P2 PCA stenosis. Bilateral PCA branch vessel irregular narrowing. 8. 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 Electronically Signed   By: Logan Bores M.D.   On: 11/02/2015 17:55   Mr Brain Wo Contrast  11/02/2015  CLINICAL DATA:  TIA. Acute onset speech difficulty and right-sided weakness. EXAM: MRI HEAD WITHOUT CONTRAST MRA HEAD WITHOUT CONTRAST TECHNIQUE: Multiplanar, multiecho pulse sequences of the brain and surrounding structures were obtained without intravenous contrast. Angiographic images of the head were obtained using MRA technique without contrast. COMPARISON:  Head CT 11/02/2015 and MRI 08/11/2014 FINDINGS: MRI HEAD FINDINGS There is a moderate-sized, late subacute to chronic right cerebellar PICA territory infarct, new from the prior MRI. There are small acute infarcts involving the right hippocampus, right occipital lobe, and right thalamus with a punctate acute infarct also noted along the anteroinferior margin of the atrium of the right lateral ventricle. A punctate acute cortical infarct is also noted in the posterior left frontal lobe. There is a small right parietal cortical infarct which is new from the prior MRI but chronic. There is also a chronic medial left occipital lobe infarct, acute on  the prior MRI. Areas of laminar necrosis/ blood breakdown products are noted associated with the cerebellar, left occipital, and right parietal infarcts. There is no evidence of mass, midline shift, or extra-axial fluid collection. Mild global cerebral atrophy is noted. Orbits are unremarkable. Paranasal sinuses and mastoid air cells are clear. Major intracranial vascular flow voids are preserved. MRA HEAD FINDINGS Mildly motion degraded examination. The visualized distal vertebral arteries are patent with the left being dominant. PICA and SCA origins are patent although there is evidence of mild-to-moderate right and high-grade left proximal SCA stenoses. Basilar artery is patent without stenosis. Posterior communicating arteries are not clearly identified. There is a severe proximal left PCA stenosis near the P1 - P2 junction with irregularity and attenuation of the left PCA distal to this. Right P1 segment is widely  patent. There is moderate narrowing of the proximal right P2 segment with attenuation and irregularity of the right PCA more distally. The internal carotid arteries are patent from skullbase to carotid termini with minimal left supraclinoid ICA narrowing. There is a 1.5 mm inferiorly directed outpouching from the proximal right supraclinoid ICA. ACAs and MCAs are patent without evidence of significant proximal stenosis or sizable branch vessel occlusion. Mild MCA branch vessel irregularity is present bilaterally. IMPRESSION: 1. Acute posterior circulation infarcts involving the right hippocampus, right occipital lobe, and right thalamus. 2. Punctate acute cortical infarct in the posterior left frontal lobe. 3. Late subacute to chronic right cerebellar PICA territory infarct. 4. Small, chronic right parietal infarct, new from prior MRI. 5. Chronic left occipital infarct. 6. No large vessel occlusion. 7. Severe proximal left PCA stenosis. Moderate right P2 PCA stenosis. Bilateral PCA branch vessel  irregular narrowing. 8. 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 Electronically Signed   By: Logan Bores M.D.   On: 11/02/2015 17:55   I have personally reviewed and evaluated these images and lab results as part of my medical decision-making.   EKG Interpretation   Date/Time:  Wednesday November 02 2015 08:30:32 EST Ventricular Rate:  93 PR Interval:  212 QRS Duration: 84 QT Interval:  363 QTC Calculation: 451 R Axis:   41 Text Interpretation:  Sinus rhythm Borderline prolonged PR interval RSR'  in V1 or V2, right VCD or RVH Confirmed by Jeneen Rinks  MD, Maupin ([id number]) on  11/02/2015 8:46:48 AM      MDM   Final diagnoses:  Transient cerebral ischemia, unspecified transient cerebral ischemia type        Tanna Furry, MD 11/03/15 [phone]

## 2015-11-02 NOTE — Code Documentation (Signed)
81yo female arriving to [facility] via [place] at [phone].  EMS reports that the patient got up at 0630 at her baseline and went to get a tissue at [facility] when her daughter noticed she was having difficulty speaking.  Daughter called EMS who activated a code stroke for difficulty with word finding and right facial droop.  EMS reports this improved en route.  Stroke team at the bedside on arrival.  Labs drawn and patient cleared for CT by Dr. Jeneen Rinks.  Patient to CT.  NIHSS 2, see documentation for details and code stroke times.  Patient unable to state age and has slight left facial asymmetry.  Patient's daughter at the bedside who reports patient has a h/o stroke.  Dr. Nicole Kindred at the bedside.  No acute stroke treatment at this time, however, patient remains in the window to treat with tPA until 1100 should symptoms worsen.  Patient and family updated on plan of care.  Bedside handoff with ED RNs Martie Round and Carlis Abbott.

## 2015-11-03 ENCOUNTER — Other Ambulatory Visit (HOSPITAL_COMMUNITY): Payer: Medicare Other

## 2015-11-03 ENCOUNTER — Inpatient Hospital Stay (HOSPITAL_COMMUNITY): Payer: Medicare Other

## 2015-11-03 DIAGNOSIS — I10 Essential (primary) hypertension: Secondary | ICD-10-CM | POA: Insufficient documentation

## 2015-11-03 DIAGNOSIS — I639 Cerebral infarction, unspecified: Secondary | ICD-10-CM

## 2015-11-03 DIAGNOSIS — E785 Hyperlipidemia, unspecified: Secondary | ICD-10-CM | POA: Insufficient documentation

## 2015-11-03 DIAGNOSIS — G459 Transient cerebral ischemic attack, unspecified: Secondary | ICD-10-CM

## 2015-11-03 DIAGNOSIS — Z8673 Personal history of transient ischemic attack (TIA), and cerebral infarction without residual deficits: Secondary | ICD-10-CM

## 2015-11-03 DIAGNOSIS — I63431 Cerebral infarction due to embolism of right posterior cerebral artery: Principal | ICD-10-CM

## 2015-11-03 LAB — BASIC METABOLIC PANEL
ANION GAP: 8 (ref 5–15)
BUN: 21 mg/dL — AB (ref 6–20)
CO2: 24 mmol/L (ref 22–32)
Calcium: 8.5 mg/dL — ABNORMAL LOW (ref 8.9–10.3)
Chloride: 107 mmol/L (ref 101–111)
Creatinine, Ser: 1.25 mg/dL — ABNORMAL HIGH (ref 0.44–1.00)
GFR, EST AFRICAN AMERICAN: 44 mL/min — AB (ref >60)
GFR, EST NON AFRICAN AMERICAN: 38 mL/min — AB (ref >60)
Glucose, Bld: 92 mg/dL (ref 65–99)
POTASSIUM: 4 mmol/L (ref 3.5–5.1)
SODIUM: 139 mmol/L (ref 135–145)

## 2015-11-03 LAB — CBC
HEMATOCRIT: 36.9 % (ref 36.0–46.0)
HEMOGLOBIN: 12.3 g/dL (ref 12.0–15.0)
MCH: 31.5 pg (ref 26.0–34.0)
MCHC: 33.3 g/dL (ref 30.0–36.0)
MCV: 94.4 fL (ref 78.0–100.0)
Platelets: 157 10*3/uL (ref 150–400)
RBC: 3.91 MIL/uL (ref 3.87–5.11)
RDW: 12.5 % (ref 11.5–15.5)
WBC: 6.3 10*3/uL (ref 4.0–10.5)

## 2015-11-03 LAB — PROTIME-INR
INR: 1.16 (ref 0.00–1.49)
Prothrombin Time: 14.9 seconds (ref 11.6–15.2)

## 2015-11-03 LAB — LIPID PANEL
CHOL/HDL RATIO: 2.4 ratio
CHOLESTEROL: 189 mg/dL (ref 0–200)
HDL: 78 mg/dL (ref >40)
LDL Cholesterol: 94 mg/dL (ref 0–99)
Triglycerides: 84 mg/dL (ref <150)
VLDL: 17 mg/dL (ref 0–40)

## 2015-11-03 MED ORDER — CLOPIDOGREL BISULFATE 75 MG PO TABS
75.0000 mg | ORAL_TABLET | Freq: Every day | ORAL | Status: DC
  Administered 2015-11-03 – 2015-11-04 (×2): 75 mg via ORAL
  Filled 2015-11-03 (×2): qty 1

## 2015-11-03 MED ORDER — ATORVASTATIN CALCIUM 40 MG PO TABS
40.0000 mg | ORAL_TABLET | Freq: Every day | ORAL | Status: DC
  Administered 2015-11-03: 40 mg via ORAL
  Filled 2015-11-03: qty 1

## 2015-11-03 NOTE — Progress Notes (Signed)
  Echocardiogram 2D Echocardiogram has been performed.  Nancy Rowland 11/03/2015, 4:08 PM

## 2015-11-03 NOTE — Progress Notes (Signed)
VASCULAR LAB PRELIMINARY  PRELIMINARY  PRELIMINARY  PRELIMINARY  Carotid duplex completed.    Preliminary report:  1-39% ICA plaquing.  Vertebral artery flow is antegrade.   Dymir Neeson, RVT 11/03/2015, 6:30 PM

## 2015-11-03 NOTE — Progress Notes (Addendum)
Subjective: Patient was seen and examined at bedside this am. She was resting comfortably in her bed and appeared to be happy and in no acute acute distress. She denied any difficulty with her speech. Denied having any weakness or numbness anywhere in her body.  Objective: Vital signs in last 24 hours: Filed Vitals:   11/02/15 2200 11/03/15 0000 11/03/15 0540 11/03/15 1056  BP: 135/77 120/51 132/65 120/54  Pulse: 87 80 84 87  Temp: 97.8 F (36.6 C) 97.8 F (36.6 C) 98 F (36.7 C) 98.3 F (36.8 C)  TempSrc: Oral Oral Oral Oral  Resp: 16 16 16 17   Height:      Weight:      SpO2: 100% 98% 98% 98%   Weight change:   Intake/Output Summary (Last 24 hours) at 11/03/15 1307 Last data filed at 11/02/15 2300  Gross per 24 hour  Intake      0 ml  Output   1000 ml  Net  -1000 ml   Physical Exam: General: Very pleasant elderly white female, alert, cooperative, NAD.   HEENT: PERRL, EOMI. Moist mucus membranes Lungs: Clear to ascultation bilaterally, normal work of respiration, no wheezes, rales, rhonchi Heart: RRR, no murmurs, gallops, or rubs Abdomen: Soft, non-tender, non-distended, BS + Extremities: No cyanosis, clubbing, or edema Neurologic: cranial nerves II-XII grossly intact, strength grossly intact, sensation intact to light touch.   Lab Results: Basic Metabolic Panel:  Recent Labs Lab 11/02/15 0823 11/03/15 0429  NA 138  137 139  K 4.3  4.2 4.0  CL 105  101 107  CO2 23 24  GLUCOSE 141*  139* 92  BUN 29*  30* 21*  CREATININE 1.44*  1.40* 1.25*  CALCIUM 9.2 8.5*   Liver Function Tests:  Recent Labs Lab 11/02/15 0823  AST 21  ALT 22  ALKPHOS 55  BILITOT 0.6  PROT 6.3*  ALBUMIN 3.6   CBC:  Recent Labs Lab 11/02/15 0823 11/03/15 0429  WBC 6.7 6.3  NEUTROABS 4.3  --   HGB 12.1  12.6 12.3  HCT 36.2  37.0 36.9  MCV 95.5 94.4  PLT 163 157   Cardiac Enzymes:  Recent Labs Lab 11/02/15 0815 11/02/15 1436 11/02/15 2000  TROPONINI 0.55*  0.53* 0.48*   Fasting Lipid Panel:  Recent Labs Lab 11/03/15 0429  CHOL 189  HDL 78  LDLCALC 94  TRIG 84  CHOLHDL 2.4   Coagulation:  Recent Labs Lab 11/02/15 0823 11/03/15 0429  LABPROT 14.0 14.9  INR 1.06 1.16   Urine Drug Screen: Drugs of Abuse     Component Value Date/Time   LABOPIA NONE DETECTED 11/02/2015 1036   COCAINSCRNUR NONE DETECTED 11/02/2015 1036   LABBENZ NONE DETECTED 11/02/2015 1036   AMPHETMU NONE DETECTED 11/02/2015 1036   THCU NONE DETECTED 11/02/2015 1036   LABBARB NONE DETECTED 11/02/2015 1036    Alcohol Level:  Recent Labs Lab 11/02/15 0823  ETH <5   Urinalysis:  Recent Labs Lab 11/02/15 1036  COLORURINE YELLOW  LABSPEC 1.010  PHURINE 6.0  GLUCOSEU NEGATIVE  HGBUR NEGATIVE  BILIRUBINUR NEGATIVE  KETONESUR NEGATIVE  PROTEINUR NEGATIVE  NITRITE NEGATIVE  LEUKOCYTESUR NEGATIVE   Studies/Results: Ct Head Wo Contrast  11/02/2015  CLINICAL DATA:  Code stroke.  Stroke symptoms. EXAM: CT HEAD WITHOUT CONTRAST TECHNIQUE: Contiguous axial images were obtained from the base of the skull through the vertex without intravenous contrast. COMPARISON:  Head CT dated 05/08/2015. FINDINGS: There is mild generalized brain atrophy with commensurate dilatation of  the ventricles and sulci. Mild chronic small vessel ischemic changes again noted within the deep periventricular white matter regions bilaterally. There is an old infarct with associated encephalomalacia in the lower left occipital lobe. There are additional old infarcts with associated encephalomalacia in the upper right parietal-occipital lobe and right cerebellum. Smaller ill-defined low-density area within the lower right occipital lobe (images 11 through 13 on axial series 201) is new compared to the previous head CT of 05/08/2015, most suggestive of additional subacute-to-chronic infarct. There is no convincing mass, hemorrhage, or other evidence of acute parenchymal abnormality. No  extra-axial hemorrhage. No acute osseous abnormality. Paranasal sinuses are clear. Mastoid air cells are clear. IMPRESSION: 1. Old infarcts within the left occipital lobe, right upper parietal-occipital lobe, and right cerebellum. 2. Additional ill-defined low-density area within the lower right occipital lobe is new compared to the head CT of 05/08/2015 suggesting additional interval infarct. This is of uncertain age but favored to be subacute or chronic. 3. No convincing evidence of an acute intracranial abnormality. No hemorrhage. Critical Value/emergent results were called by telephone at the time of interpretation on 11/02/2015 at 8:40 am to Dr. Tanna Furry , who verbally acknowledged these results. Electronically Signed   By: Franki Cabot M.D.   On: 11/02/2015 08:46   Mr Jodene Nam Head Wo Contrast  11/02/2015  CLINICAL DATA:  TIA. Acute onset speech difficulty and right-sided weakness. EXAM: MRI HEAD WITHOUT CONTRAST MRA HEAD WITHOUT CONTRAST TECHNIQUE: Multiplanar, multiecho pulse sequences of the brain and surrounding structures were obtained without intravenous contrast. Angiographic images of the head were obtained using MRA technique without contrast. COMPARISON:  Head CT 11/02/2015 and MRI 08/11/2014 FINDINGS: MRI HEAD FINDINGS There is a moderate-sized, late subacute to chronic right cerebellar PICA territory infarct, new from the prior MRI. There are small acute infarcts involving the right hippocampus, right occipital lobe, and right thalamus with a punctate acute infarct also noted along the anteroinferior margin of the atrium of the right lateral ventricle. A punctate acute cortical infarct is also noted in the posterior left frontal lobe. There is a small right parietal cortical infarct which is new from the prior MRI but chronic. There is also a chronic medial left occipital lobe infarct, acute on the prior MRI. Areas of laminar necrosis/ blood breakdown products are noted associated with the  cerebellar, left occipital, and right parietal infarcts. There is no evidence of mass, midline shift, or extra-axial fluid collection. Mild global cerebral atrophy is noted. Orbits are unremarkable. Paranasal sinuses and mastoid air cells are clear. Major intracranial vascular flow voids are preserved. MRA HEAD FINDINGS Mildly motion degraded examination. The visualized distal vertebral arteries are patent with the left being dominant. PICA and SCA origins are patent although there is evidence of mild-to-moderate right and high-grade left proximal SCA stenoses. Basilar artery is patent without stenosis. Posterior communicating arteries are not clearly identified. There is a severe proximal left PCA stenosis near the P1 - P2 junction with irregularity and attenuation of the left PCA distal to this. Right P1 segment is widely patent. There is moderate narrowing of the proximal right P2 segment with attenuation and irregularity of the right PCA more distally. The internal carotid arteries are patent from skullbase to carotid termini with minimal left supraclinoid ICA narrowing. There is a 1.5 mm inferiorly directed outpouching from the proximal right supraclinoid ICA. ACAs and MCAs are patent without evidence of significant proximal stenosis or sizable branch vessel occlusion. Mild MCA branch vessel irregularity is present bilaterally.  IMPRESSION: 1. Acute posterior circulation infarcts involving the right hippocampus, right occipital lobe, and right thalamus. 2. Punctate acute cortical infarct in the posterior left frontal lobe. 3. Late subacute to chronic right cerebellar PICA territory infarct. 4. Small, chronic right parietal infarct, new from prior MRI. 5. Chronic left occipital infarct. 6. No large vessel occlusion. 7. Severe proximal left PCA stenosis. Moderate right P2 PCA stenosis. Bilateral PCA branch vessel irregular narrowing. 8. 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 Electronically Signed   By:  Logan Bores M.D.   On: 11/02/2015 17:55   Mr Brain Wo Contrast  11/02/2015  CLINICAL DATA:  TIA. Acute onset speech difficulty and right-sided weakness. EXAM: MRI HEAD WITHOUT CONTRAST MRA HEAD WITHOUT CONTRAST TECHNIQUE: Multiplanar, multiecho pulse sequences of the brain and surrounding structures were obtained without intravenous contrast. Angiographic images of the head were obtained using MRA technique without contrast. COMPARISON:  Head CT 11/02/2015 and MRI 08/11/2014 FINDINGS: MRI HEAD FINDINGS There is a moderate-sized, late subacute to chronic right cerebellar PICA territory infarct, new from the prior MRI. There are small acute infarcts involving the right hippocampus, right occipital lobe, and right thalamus with a punctate acute infarct also noted along the anteroinferior margin of the atrium of the right lateral ventricle. A punctate acute cortical infarct is also noted in the posterior left frontal lobe. There is a small right parietal cortical infarct which is new from the prior MRI but chronic. There is also a chronic medial left occipital lobe infarct, acute on the prior MRI. Areas of laminar necrosis/ blood breakdown products are noted associated with the cerebellar, left occipital, and right parietal infarcts. There is no evidence of mass, midline shift, or extra-axial fluid collection. Mild global cerebral atrophy is noted. Orbits are unremarkable. Paranasal sinuses and mastoid air cells are clear. Major intracranial vascular flow voids are preserved. MRA HEAD FINDINGS Mildly motion degraded examination. The visualized distal vertebral arteries are patent with the left being dominant. PICA and SCA origins are patent although there is evidence of mild-to-moderate right and high-grade left proximal SCA stenoses. Basilar artery is patent without stenosis. Posterior communicating arteries are not clearly identified. There is a severe proximal left PCA stenosis near the P1 - P2 junction with  irregularity and attenuation of the left PCA distal to this. Right P1 segment is widely patent. There is moderate narrowing of the proximal right P2 segment with attenuation and irregularity of the right PCA more distally. The internal carotid arteries are patent from skullbase to carotid termini with minimal left supraclinoid ICA narrowing. There is a 1.5 mm inferiorly directed outpouching from the proximal right supraclinoid ICA. ACAs and MCAs are patent without evidence of significant proximal stenosis or sizable branch vessel occlusion. Mild MCA branch vessel irregularity is present bilaterally. IMPRESSION: 1. Acute posterior circulation infarcts involving the right hippocampus, right occipital lobe, and right thalamus. 2. Punctate acute cortical infarct in the posterior left frontal lobe. 3. Late subacute to chronic right cerebellar PICA territory infarct. 4. Small, chronic right parietal infarct, new from prior MRI. 5. Chronic left occipital infarct. 6. No large vessel occlusion. 7. Severe proximal left PCA stenosis. Moderate right P2 PCA stenosis. Bilateral PCA branch vessel irregular narrowing. 8. 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 Electronically Signed   By: Logan Bores M.D.   On: 11/02/2015 17:55   Medications: I have reviewed the patient's current medications. Scheduled Meds: . ARIPiprazole  5 mg Oral Daily  . aspirin EC  81 mg Oral  Daily  . atorvastatin  40 mg Oral q1800  . busPIRone  20 mg Oral BID  . clopidogrel  75 mg Oral Daily  . heparin  5,000 Units Subcutaneous 3 times per day  . sodium chloride  3 mL Intravenous Q12H  . venlafaxine XR  150 mg Oral Q breakfast   Continuous Infusions:  PRN Meds:. Assessment/Plan: Active Problems:   TIA (transient ischemic attack)   Stroke (HCC)   History of stroke   HLD (hyperlipidemia)   Essential hypertension  Recurrent TIA: Patient presented with symptoms of right-sided weakness, right facial droop, slurred speech, and  receptive aphasia at home. Brought to the ED as Code Stroke but patient's symptoms improved significantly. MRI of brain showing acute posterior circulation infarcts involving the right hippocampus, right occipital lobe, and right thalamus. Punctate acute cortical infarct in the posterior left frontal lobe. Late subacute to chronic right cerebellar PICA territory infarct. Small, chronic right parietal infarct, new from prior MRI. Chronic left occipital infarct. No large vessel occlusion. Severe proximal left PCA stenosis. Moderate right P2 PCA stenosis. Bilateral PCA branch vessel irregular narrowing. 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  The nature of her infarcts points to an embolic origin of unknown source (possibly cardiac? - afib vs endocarditis). If no vegetations or afib, next step would be workup for vasculitis. On exam today, she is not having any difficulty with her speech, strength and sensation in b/l upper and lower extremities grossly intact. Lipid panel showing LDL 94.  -Monitor on telemetry  -Aspirin 81 mg daily + Plavix 75 mg daily for 3 months. Then Plavix alone as per neuro recs.  -ECHO pending  -CA dopplers pending  -HbA1c pending  -Hold Losartan for permissive HTN -will need outpatient 30 day cardiac event monitoring to rule out A-fib  -HHPT/ OT  Troponin Elevation: Troponin 0.55 on admission, EKG with no ischemic changes. Suspect this is related to CVA/TIA vs demand ischemia. Patient also with a mild AKI as well. She denies any SOB, chest pain, palpitations, dizziness, or lightheadedness. Could also be related to a cardioembolic event (?atrial fibrillation) which involved the coronaries and brain. Troponin trended down to 0.48.  -Telemetry -Repeat EKG pending  -Continue ASA as above  AKI: Mild, although her baseline is not known. Scr down to 1.25 today, was 1.44 on admission.  -encourage PO intake  -BMP in AM  HLD: Atorvastatin 40 mg daily   Diet: regular, thin  liquids   DVT/PE PPx: Heparin 5000u SQ TID  Dispo: Disposition is deferred at this time, awaiting improvement of current medical problems.  Anticipated discharge in approximately 2-3 day(s).   The patient does have a current PCP (No primary care provider on file.) and does need an [facility] hospital follow-up appointment after discharge.  The patient does not have transportation limitations that hinder transportation to clinic appointments.  .Services Needed at time of discharge: Y = Yes, Blank = No PT:   OT:   RN:   Equipment:   Other:     LOS: 1 day   Shela Leff, MD 11/03/2015, 1:07 PM

## 2015-11-03 NOTE — Progress Notes (Signed)
STROKE TEAM PROGRESS NOTE   HISTORY Nancy Rowland is an 81 y.o. female with a history of hypertension and hyperlipidemia as well as stroke and TIA, brought to the emergency room and code stroke status following the acute onset of speech changes and right-sided weakness. Speech was described as slurred and unintelligible. She had right facial droop and also had right extremity weakness. She was last known well at 6:30 AM today 11/02/2015. She's been taking aspirin 81 mg per day. CT scan of her head showed no acute intracranial abnormality. Old left occipital encephalomalacia as well as likely old right occipital infarction were noted. NIH stroke score was 2. Patient markedly improved in route to the emergency room. Patient was not administered TPA secondary to Rapidly resolving deficits. She was admitted  for further evaluation and treatment.   SUBJECTIVE (INTERVAL HISTORY) Her 2 daughters, Amy and Di Kindle are at the bedside.   Overall they feel her condition is resolved. Report she ate breakfast, did very well, and is now taking a nap.   OBJECTIVE Temp:  [97.6 F (36.4 C)-98.4 F (36.9 C)] 98 F (36.7 C) (11/24 0540) Pulse Rate:  [80-100] 84 (11/24 0540) Cardiac Rhythm:  [-] Normal sinus rhythm (11/24 0800) Resp:  [15-22] 16 (11/24 0540) BP: (118-160)/(51-87) 132/65 mmHg (11/24 0540) SpO2:  [98 %-100 %] 98 % (11/24 0540)  CBC:  Recent Labs Lab 11/02/15 0823 11/03/15 0429  WBC 6.7 6.3  NEUTROABS 4.3  --   HGB 12.1  12.6 12.3  HCT 36.2  37.0 36.9  MCV 95.5 94.4  PLT 163 [id number]    Basic Metabolic Panel:  Recent Labs Lab 11/02/15 0823 11/03/15 0429  NA 138  137 139  K 4.3  4.2 4.0  CL 105  101 107  CO2 23 24  GLUCOSE 141*  139* 92  BUN 29*  30* 21*  CREATININE 1.44*  1.40* 1.25*  CALCIUM 9.2 8.5*    Lipid Panel:    Component Value Date/Time   CHOL 189 11/03/2015 0429   TRIG 84 11/03/2015 0429   HDL 78 11/03/2015 0429   CHOLHDL 2.4 11/03/2015 0429   VLDL 17  11/03/2015 0429   LDLCALC 94 11/03/2015 0429   HgbA1c: No results found for: HGBA1C Urine Drug Screen:    Component Value Date/Time   LABOPIA NONE DETECTED 11/02/2015 1036   COCAINSCRNUR NONE DETECTED 11/02/2015 1036   LABBENZ NONE DETECTED 11/02/2015 1036   AMPHETMU NONE DETECTED 11/02/2015 1036   THCU NONE DETECTED 11/02/2015 1036   LABBARB NONE DETECTED 11/02/2015 1036      IMAGING I have personally reviewed the radiological images below and agree with the radiology interpretations. Blue text is my interpretation.  Ct Head Wo Contrast 11/02/2015   1. Old infarcts within the left occipital lobe, right upper parietal-occipital lobe, and right cerebellum. 2. Additional ill-defined low-density area within the lower right occipital lobe is new compared to the head CT of 05/08/2015 suggesting additional interval infarct. This is of uncertain age but favored to be subacute or chronic. 3. No convincing evidence of an acute intracranial abnormality. No hemorrhage.  Mri & Mra Brain Wo Contrast 11/02/2015   1. Acute posterior circulation infarcts involving the right hippocampus, right occipital lobe, and right thalamus. 2. Punctate acute cortical infarct in the posterior left frontal lobe. 3. Late subacute to chronic right cerebellar PICA territory infarct. 4. Small, chronic right parietal infarct, new from prior MRI. 5. Chronic left occipital infarct. 6. No large vessel occlusion. 7. Severe  proximal left PCA stenosis. Moderate right P2 PCA stenosis. Bilateral PCA branch vessel irregular narrowing. 8. 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 On my interpretation, left frontal punctate infarct is too subtle to call.   2D Echocardiogram  pending   Carotid Doppler   pending   LE venous dopplers pending    PHYSICAL EXAM  Temp:  [97.6 F (36.4 C)-98.4 F (36.9 C)] 98.3 F (36.8 C) (11/24 1056) Pulse Rate:  [80-100] 87 (11/24 1056) Resp:  [15-17] 17 (11/24 1056) BP:  (120-160)/(51-77) 120/54 mmHg (11/24 1056) SpO2:  [98 %-100 %] 98 % (11/24 1056)  General - Well nourished, well developed, in no apparent distress.  Ophthalmologic - Fundi not visualized due to noncooperation.  Cardiovascular - Regular rate and rhythm.  Mental Status -  Level of arousal and orientation to place, and person were intact, not orientated to the year. Language including expression, naming, repetition, comprehension was assessed and found intact. Fund of Knowledge was assessed and was impaired.  Cranial Nerves II - XII - II - Visual field intact OU. III, IV, VI - Extraocular movements intact. V - Facial sensation intact bilaterally. VII - Facial movement intact bilaterally. VIII - Hearing & vestibular intact bilaterally. X - Palate elevates symmetrically. XI - Chin turning & shoulder shrug intact bilaterally. XII - Tongue protrusion intact.  Motor Strength - The patient's strength was normal in all extremities and pronator drift was absent.  Bulk was normal and fasciculations were absent.   Motor Tone - Muscle tone was assessed at the neck and appendages and was normal.  Reflexes - The patient's reflexes were 1+ in all extremities and she had no pathological reflexes.  Sensory - Light touch, temperature/pinprick were assessed and were symmetrical.    Coordination - The patient had normal movements in the hands and feet with no ataxia or dysmetria.  Tremor was absent.  Gait and Station - not tested due to safety concerns.   ASSESSMENT/PLAN Ms. JAMAIYAH RECHNER is a 81 y.o. female with history of HTN, HLD, previous CVA/TIA, breast cancer and uterine fibroids presenting with right sided weakness and speech difficulties. She did not receive IV t-PA due to rapidly resolving deficits.   Stroke:  Dominant right PCA infarcts and small left frontal infarct, felt to be embolic secondary to unknown source  MRI   right PCA infarcts. old right cerebellar PICA infarct. Old right  parietal infarct. Old left occipital infarct.   MRA  No large vessel occlusion. Severe proximal left PCA stenosis. Moderate right P2 PCA stenosis.    Carotid Doppler  pending   2D Echo  pending   LE venous dopplers pending   LDL 94  HgbA1c pending  Heparin 5000 units sq tid for VTE prophylaxis  Diet regular Room service appropriate?: Yes; Fluid consistency:: Thin  aspirin 81 mg daily prior to admission, now on aspirin 81 mg daily. Due to intracranial stenosis, recommend dual antiplatelet with ASA and plavix for 3 months and then plavix alone. Ordered.  Patient counseled to be compliant with her antithrombotic medications.  Ongoing aggressive stroke risk factor management Recommend outpatient 30 day cardiac event monitoring to rule out afib.    Therapy recommendations:  HH PT & OT  Disposition:  Pending, return home with [place] therapies  (independent with walking, bathing, feeding. probs with hearing aids and bra hook)  Hypertension  Stable  Permissive hypertension (OK if < 220/120) but gradually normalize in 5-7 days  Hyperlipidemia  Home meds:  pravachol 20,  changed to lipitor 40 in hospital  LDL 94, goal < 70  Continue statin at discharge  Other Stroke Risk Factors  Advanced age  Hx stroke/TIA - Hx of left PCA stroke last year, admitted to [place], recovered well  Other Active Problems  Hx breast cancer in 80s  Hysterectomy in 80s, fibroid related  Troponin elevation, 0.53, 0.48  AKI, Cr 1.25  Hospital day # 1  Rosalin Hawking, MD PhD Stroke Neurology 11/03/2015 12:49 PM    To contact Stroke Continuity provider, please refer to [email or web address] After hours, contact General Neurology

## 2015-11-03 NOTE — Progress Notes (Signed)
  Date: 11/03/2015  Patient name: Nancy Rowland  Medical record number: [id number]  Date of birth: Nov 05, 1929   I have seen and evaluated Nancy Rowland and discussed their care with the Residency Team. Nancy Rowland is an 81 yo female prior CVA / TIA, HTN, and HLD. Hx obtained from 2 daughters at bedside as pt has basline dementia. She was admitted to an outside hospital in Aug 2015 and May 2016 for stroke. Both times she was D/C'd on ASA. The family did not recall being told anything about an arrhythmia or heart issue. She was on ASA and prava 20. Her daughter from [place] was with her mother when she went from speaking normally to nonsense. The pt was able to stand up and walk past a stool to the other side of the table to get a tissue as she was drooling. The daughter immediately called 911. CT was negative for acute changes but MRI showed ant and post R and L acute CVA's. The daughters deny any h/o arrhthymias or other sxs. They state their mother is back to baseline.  Soc hx L lives alone. One daughter nearby, the other in [place]. Other family in area.   Fam Hx : no A Fib in family  Filed Vitals:   11/03/15 0000 11/03/15 0540  BP: 120/51 132/65  Pulse: 80 84  Temp: 97.8 F (36.6 C) 98 F (36.7 C)  Resp: 16 16  Gen : pleasant female. + HOH Skin : thin, + senile purpura H RRR no arrhthymia LCTAB laterally Neuro per Dr Rathore's exam - only abnl is possible RLE weakness 4/5 but pt c/o RLE tenderness  Pertinent labs : Cr 1.25; Trop 0.53, 0.48; LDL 94; Plt 157, HgB 12.3  Assessment and Plan: I have seen and evaluated the patient as outlined above. I agree with the formulated Assessment and Plan as detailed in the residents' admission note, with the following changes:   1. CVA - presumed embolic as B and ant and post circulation. The most important tests at this time will be ECHO (if the TTE is negative, will need to discuss TEE) and tele (she is at high risk of A Fib). Since we think  this is embolic, she has not "failed" ASA and does not need more aggressive anti-plt agents. If we are able to capture A Fib / flutter on tele, then she would benefit from anticoagulation (CHADS 2 risk is 4). If not, then we will need to discuss risks / benefits of empiric anti-coag and / or loop recorder. Vasculitis could also be cause but less likely than arrhythmia. For now, agree with continuing ASA and excalate statin to high intensity.  2. Trop elevation - no ischemic changes on EKG. Unlikely cardiac ischemia. No further intervention needed.  3. HTN - hold home cozaar 25. Follow BP  4. Dispo - discuss with soc work as pt needs 24 hrs supervision and family unable to provide. Daughters also mention pt needs help with ADL's.   Bartholomew Crews, MD 11/24/201610:04 AM

## 2015-11-03 NOTE — Progress Notes (Signed)
VASCULAR LAB PRELIMINARY  PRELIMINARY  PRELIMINARY  PRELIMINARY  Bilateral lower extremity venous duplex completed.    Preliminary report:  There is no DVT or SVT noted in the bilateral lower extremities.   Cydney Alvarenga, RVT 11/03/2015, 6:26 PM

## 2015-11-04 ENCOUNTER — Encounter (HOSPITAL_COMMUNITY): Payer: Self-pay | Admitting: *Deleted

## 2015-11-04 ENCOUNTER — Encounter (HOSPITAL_COMMUNITY)
Admission: EM | Disposition: A | Discharge status: Home Health | Payer: Self-pay | Source: Home / Self Care | Attending: Oncology

## 2015-11-04 ENCOUNTER — Inpatient Hospital Stay (HOSPITAL_COMMUNITY): Payer: Medicare Other

## 2015-11-04 DIAGNOSIS — I639 Cerebral infarction, unspecified: Secondary | ICD-10-CM

## 2015-11-04 DIAGNOSIS — I10 Essential (primary) hypertension: Secondary | ICD-10-CM

## 2015-11-04 HISTORY — PX: TEE WITHOUT CARDIOVERSION: SHX5443

## 2015-11-04 LAB — BASIC METABOLIC PANEL
Anion gap: 6 (ref 5–15)
BUN: 22 mg/dL — ABNORMAL HIGH (ref 6–20)
CALCIUM: 8.3 mg/dL — AB (ref 8.9–10.3)
CHLORIDE: 108 mmol/L (ref 101–111)
CO2: 24 mmol/L (ref 22–32)
CREATININE: 1.23 mg/dL — AB (ref 0.44–1.00)
GFR calc Af Amer: 45 mL/min — ABNORMAL LOW (ref >60)
GFR calc non Af Amer: 39 mL/min — ABNORMAL LOW (ref >60)
GLUCOSE: 98 mg/dL (ref 65–99)
Potassium: 4.1 mmol/L (ref 3.5–5.1)
Sodium: 138 mmol/L (ref 135–145)

## 2015-11-04 LAB — HEMOGLOBIN A1C
Hgb A1c MFr Bld: 6.2 % — ABNORMAL HIGH (ref 4.8–5.6)
MEAN PLASMA GLUCOSE: 131 mg/dL

## 2015-11-04 SURGERY — ECHOCARDIOGRAM, TRANSESOPHAGEAL
Anesthesia: Moderate Sedation

## 2015-11-04 MED ORDER — BUTAMBEN-TETRACAINE-BENZOCAINE 2-2-14 % EX AERO
INHALATION_SPRAY | CUTANEOUS | Status: DC | PRN
  Administered 2015-11-04: 2 via TOPICAL

## 2015-11-04 MED ORDER — FENTANYL CITRATE (PF) 100 MCG/2ML IJ SOLN
INTRAMUSCULAR | Status: DC | PRN
  Administered 2015-11-04 (×2): 12.5 ug via INTRAVENOUS

## 2015-11-04 MED ORDER — SENNOSIDES-DOCUSATE SODIUM 8.6-50 MG PO TABS: 1.0000 | ORAL_TABLET | Freq: Every day | ORAL | Status: AC

## 2015-11-04 MED ORDER — SODIUM CHLORIDE 0.9 % IV SOLN: INTRAVENOUS | Status: DC

## 2015-11-04 MED ORDER — MIDAZOLAM HCL 5 MG/ML IJ SOLN
INTRAMUSCULAR | Status: AC
  Filled 2015-11-04: qty 2

## 2015-11-04 MED ORDER — CLOPIDOGREL BISULFATE 75 MG PO TABS: 75.0000 mg | ORAL_TABLET | Freq: Every day | ORAL | Status: DC

## 2015-11-04 MED ORDER — MIDAZOLAM HCL 10 MG/2ML IJ SOLN
INTRAMUSCULAR | Status: DC | PRN
  Administered 2015-11-04: 1 mg via INTRAVENOUS
  Administered 2015-11-04: 2 mg via INTRAVENOUS

## 2015-11-04 MED ORDER — SENNOSIDES-DOCUSATE SODIUM 8.6-50 MG PO TABS: 1.0000 | ORAL_TABLET | Freq: Every day | ORAL | Status: DC

## 2015-11-04 MED ORDER — FENTANYL CITRATE (PF) 100 MCG/2ML IJ SOLN
INTRAMUSCULAR | Status: AC
  Filled 2015-11-04: qty 2

## 2015-11-04 MED ORDER — DIPHENHYDRAMINE HCL 50 MG/ML IJ SOLN
INTRAMUSCULAR | Status: AC
  Filled 2015-11-04: qty 1

## 2015-11-04 MED ORDER — ATORVASTATIN CALCIUM 40 MG PO TABS: 40.0000 mg | ORAL_TABLET | Freq: Every day | ORAL | Status: DC

## 2015-11-04 NOTE — Progress Notes (Signed)
Occupational Therapy Treatment Patient Details Name: Nancy Rowland MRN: [id number] DOB: March 29, 1929 Today's Date: 11/04/2015    History of present illness 81 y.o. female with a history of hypertension and hyperlipidemia as well as stroke and TIA, brought to the emergency room and code stroke status following the acute onset of speech changes and right-sided weakness. Speech was described as slurred and unintelligible. She had right facial droop and also had right extremity weakness.   OT comments  Patient fully awake and willing to work with therapist. Patient progressing nicely towards OT goals, will continue plan of care for now. Pt continues to require min guard assist for mobility and up to min assist for ADLs. No AD used for mobility or ADLs at this time. Two daughters present during this OT session and therapist reiterated importance of 24/7 supervision post acute d/c. Educated both on how 3-in-1 can be used in tub/shower and encouraged them to wait until [facility] comes to help problem solve since they have a small bathroom.    Follow Up Recommendations  Home health OT;Supervision/Assistance - 24 hour    Equipment Recommendations  3 in 1 bedside comode;Tub/shower bench    Recommendations for Other Services  None at this time   Precautions / Restrictions Precautions Precautions: Fall Precaution Comments: essential tremors at baseline Restrictions Weight Bearing Restrictions: No    Mobility Bed Mobility Overal bed mobility: Needs Assistance Bed Mobility: Supine to Sit     Supine to sit: Supervision     General bed mobility comments: increased time, elevated head of bed  Transfers Overall transfer level: Needs assistance Equipment used: 1 person hand held assist;None Transfers: Sit to/from Stand Sit to Stand: Min guard General transfer comment: cues and steady assist for safety    Balance Overall balance assessment: Needs assistance Sitting-balance support: No upper  extremity supported;Feet supported Sitting balance-Leahy Scale: Good     Standing balance support: No upper extremity supported;During functional activity Standing balance-Leahy Scale: Fair   ADL Overall ADL's : Needs assistance/impaired     Grooming: Set up;Supervision/safety;Standing   Upper Body Bathing: Supervision/ safety;Set up;Standing   Lower Body Bathing: Minimal assistance Lower Body Bathing Details (indicate cue type and reason): difficulty with reaching feet Upper Body Dressing : Supervision/safety;Set up;Sitting   Lower Body Dressing: Minimal assistance;Sit to/from stand   Toilet Transfer: Min guard;Ambulation   Toileting- Clothing Manipulation and Hygiene: Min guard;Sit to/from stand Functional mobility during ADLs: Min guard (hand held assist) General ADL Comments: 2 daughters present during this OT session. Educated patient and family on tub/shower transfers using 3-in-1 with handout given. Encouraged patient and daughters to wait until [facility] assists at home for patient and family safety. Pt ambulated in hallway and back to room with min guard hand held assistance. Pt HOH and required cues for safety.      Vision Additional Comments: continue to assess          Cognition   Behavior During Therapy: [facility] for tasks assessed/performed Overall Cognitive Status: History of cognitive impairments - at baseline Problem Solving: Slow processing;Requires verbal cues;Requires tactile cues                   Pertinent Vitals/ Pain       Pain Assessment: No/denies pain   Frequency Min 2X/week     Progress Toward Goals  OT Goals(current goals can now befound in the care plan section)  Progress towards OT goals: Progressing toward goals     Plan Discharge plan remains  appropriate    End of Session Equipment Utilized During Treatment: Gait belt   Activity Tolerance Patient tolerated treatment well   Patient Left in chair;with call bell/phone within  reach;with chair alarm set  Nurse Communication Mobility status     Time: [id number] OT Time Calculation (min): 26 min  Charges: OT General Charges $OT Visit: 1 Procedure OT Treatments $Self Care/Home Management : 8-22 mins $Therapeutic Activity: 8-22 mins  Aamirah Salmi , MS, OTR/L, CLT Pager: [phone]  11/04/2015, 2:50 PM

## 2015-11-04 NOTE — Care Management Note (Addendum)
Case Management Note  Patient Details  Name: Nancy Rowland MRN: [id number] Date of Birth: 1929/09/18  Subjective/Objective:                    Action/Plan: MD discharging patient home with home health services. CM spoke again with the daughters and they are willing to take their mother home with home health and attempt to get her into an ALF from home. CM asked Lorriane Shire with CSW to please speak with the daughters about how to go about getting mother into ALF from home. Lorriane Shire said she would speak with family. CM provided the daughters with a list of home health agencies in the [facility] area. They selected [facility] home health. CM notified them of the referral and faxed them the information they requested. CM also provided them with a Private Duty list to help with care at home. Patient does have a PCP: Dr. Laqueta Due. Bedside RN updated.   Expected Discharge Date:                  Expected Discharge Plan:  [place]  In-House Referral:     Discharge planning Services  CM Consult  Post Acute Care Choice:  Home Health Choice offered to:  Adult Children  DME Arranged:    DME Agency:     HH Arranged:  PT, OT [place] Agency:  [place]  Status of Service:  Completed, signed off  Medicare Important Message Given:    Date Medicare IM Given:    Medicare IM give by:    Date Additional Medicare IM Given:    Additional Medicare Important Message give by:     If discussed at [place] of Stay Meetings, dates discussed:    Additional Comments:  Pollie Friar, RN 11/04/2015, 1:43 PM

## 2015-11-04 NOTE — Progress Notes (Signed)
Pt discharged home with daughters. Will be receiving home health services. IV removed. Discharge instructions given written and verbal. EMMI consent signed. Leaving floor with NT via wheelchair. Wendee Copp

## 2015-11-04 NOTE — H&P (View-Only) (Signed)
    [facility] has been requested to perform a transesophageal echocardiogram on Nancy Rowland  for acute posterior circulation infarcts.  After careful review of history and examination, the risks and benefits of transesophageal echocardiogram have been explained including risks of esophageal damage, perforation (1:10,000 risk), bleeding, pharyngeal hematoma as well as other potential complications associated with conscious sedation including aspiration, arrhythmia, respiratory failure and death. Alternatives to treatment were discussed, questions were answered. Patient is willing to proceed.   BP has been 113/51 - 121/67 in the past 24 hours. No requirement for pressors. Hbg stable at 12.3. Platelets at 157.  Erma Heritage, PA-C 11/04/2015 8:22 AM

## 2015-11-04 NOTE — Progress Notes (Signed)
Name: Nancy Rowland MRN: [id number] DOB: 09-15-1929 81 y.o. PCP: No primary care provider on file.  Date of Admission: 11/02/2015  8:15 AM Date of Discharge: 11/06/2015 Attending Physician: No att. providers found  Discharge Diagnosis:  Active Problems:   TIA (transient ischemic attack)   Ischemic stroke ([place])   History of stroke   HLD (hyperlipidemia)   Essential hypertension  Discharge Medications:   Medication List    STOP taking these medications        pravastatin 20 MG tablet  Commonly known as:  PRAVACHOL      TAKE these medications        ARIPiprazole 5 MG tablet  Commonly known as:  ABILIFY  Take 5 mg by mouth daily.     aspirin EC 81 MG tablet  Take 81 mg by mouth daily.     atorvastatin 40 MG tablet  Commonly known as:  LIPITOR  Take 1 tablet (40 mg total) by mouth daily at 6 PM.     busPIRone 10 MG tablet  Commonly known as:  BUSPAR  Take 20 mg by mouth 2 (two) times daily.     clopidogrel 75 MG tablet  Commonly known as:  PLAVIX  Take 1 tablet (75 mg total) by mouth daily.     desvenlafaxine 100 MG 24 hr tablet  Commonly known as:  PRISTIQ  Take 100 mg by mouth daily.     losartan 25 MG tablet  Commonly known as:  COZAAR  Take 25 mg by mouth daily.     senna-docusate 8.6-50 MG tablet  Commonly known as:  Senokot-S  Take 1 tablet by mouth at bedtime.        Disposition and follow-up:   [name] was discharged from [facility] in Stable condition.  At the hospital follow up visit please address:  1. Patient needs to follow up with cardiology as soon and possible for a loop monitor to r/o A-fib.  She is to take Aspirin 81 mg + Plavix 75 mg daily for 3 months and then Plavix alone after that as per neurology recommendation.    2.  Labs / imaging needed at time of follow-up:   3.  Pending labs/ test needing follow-up:  Follow-up Appointments: Follow-up Information    Schedule an appointment as soon as  possible for a visit with Cristopher Peru, MD.   Specialty:  Cardiology   Why:  offic closed for holiday   Contact information:   [address] [place] [address] [phone]       Follow up with Xu,Jindong, MD. Schedule an appointment as soon as possible for a visit in 2 months.   Specialty:  Neurology   Why:  Follow up, stroke clinic,office closed for holiday   Contact information:   [address] [place] [address] [phone]       Follow up with PROCHNAU,CAROLINE, MD. Schedule an appointment as soon as possible for a visit in 2 weeks.   Specialty:  Internal Medicine   Why:  Follow up,office closed for holiday   Contact information:   [address] [place] [address] [phone]       Please follow up.   Why:  attempt to make appt,office closed      Discharge Instructions: Discharge Instructions    Ambulatory referral to Neurology    Complete by:  As directed   An appointment is requested in approximately: 8 weeks.  For stroke follow up with Dr. Erlinda Hong in [facility].           Consultations:    Procedures Performed:  Ct Head Wo Contrast  11/02/2015  CLINICAL DATA:  Code stroke.  Stroke symptoms. EXAM: CT HEAD WITHOUT CONTRAST TECHNIQUE: Contiguous axial images were obtained from the base of the skull through the vertex without intravenous contrast. COMPARISON:  Head CT dated 05/08/2015. FINDINGS: There is mild generalized brain atrophy with commensurate dilatation of the ventricles and sulci. Mild chronic small vessel ischemic changes again noted within the deep periventricular white matter regions bilaterally. There is an old infarct with associated encephalomalacia in the lower left occipital lobe. There are additional old infarcts with associated encephalomalacia in the upper right parietal-occipital lobe and right cerebellum. Smaller ill-defined low-density area within the lower right occipital lobe (images 11 through 13 on axial series  201) is new compared to the previous head CT of 05/08/2015, most suggestive of additional subacute-to-chronic infarct. There is no convincing mass, hemorrhage, or other evidence of acute parenchymal abnormality. No extra-axial hemorrhage. No acute osseous abnormality. Paranasal sinuses are clear. Mastoid air cells are clear. IMPRESSION: 1. Old infarcts within the left occipital lobe, right upper parietal-occipital lobe, and right cerebellum. 2. Additional ill-defined low-density area within the lower right occipital lobe is new compared to the head CT of 05/08/2015 suggesting additional interval infarct. This is of uncertain age but favored to be subacute or chronic. 3. No convincing evidence of an acute intracranial abnormality. No hemorrhage. Critical Value/emergent results were called by telephone at the time of interpretation on 11/02/2015 at 8:40 am to Dr. Tanna Furry , who verbally acknowledged these results. Electronically Signed   By: Franki Cabot M.D.   On: 11/02/2015 08:46   Mr Nancy Rowland Head Wo Contrast  11/02/2015  CLINICAL DATA:  TIA. Acute onset speech difficulty and right-sided weakness. EXAM: MRI HEAD WITHOUT CONTRAST MRA HEAD WITHOUT CONTRAST TECHNIQUE: Multiplanar, multiecho pulse sequences of the brain and surrounding structures were obtained without intravenous contrast. Angiographic images of the head were obtained using MRA technique without contrast. COMPARISON:  Head CT 11/02/2015 and MRI 08/11/2014 FINDINGS: MRI HEAD FINDINGS There is a moderate-sized, late subacute to chronic right cerebellar PICA territory infarct, new from the prior MRI. There are small acute infarcts involving the right hippocampus, right occipital lobe, and right thalamus with a punctate acute infarct also noted along the anteroinferior margin of the atrium of the right lateral ventricle. A punctate acute cortical infarct is also noted in the posterior left frontal lobe. There is a small right parietal cortical infarct  which is new from the prior MRI but chronic. There is also a chronic medial left occipital lobe infarct, acute on the prior MRI. Areas of laminar necrosis/ blood breakdown products are noted associated with the cerebellar, left occipital, and right parietal infarcts. There is no evidence of mass, midline shift, or extra-axial fluid collection. Mild global cerebral atrophy is noted. Orbits are unremarkable. Paranasal sinuses and mastoid air cells are clear. Major intracranial vascular flow voids are preserved. MRA HEAD FINDINGS Mildly motion degraded examination. The visualized distal vertebral arteries are patent with the left being dominant. PICA and SCA origins are patent although there is evidence of mild-to-moderate right and high-grade left proximal SCA stenoses. Basilar artery is patent without stenosis. Posterior communicating arteries are not clearly identified. There is a severe proximal left PCA stenosis near the P1 - P2 junction with irregularity and attenuation of the left PCA distal to this.  Right P1 segment is widely patent. There is moderate narrowing of the proximal right P2 segment with attenuation and irregularity of the right PCA more distally. The internal carotid arteries are patent from skullbase to carotid termini with minimal left supraclinoid ICA narrowing. There is a 1.5 mm inferiorly directed outpouching from the proximal right supraclinoid ICA. ACAs and MCAs are patent without evidence of significant proximal stenosis or sizable branch vessel occlusion. Mild MCA branch vessel irregularity is present bilaterally. IMPRESSION: 1. Acute posterior circulation infarcts involving the right hippocampus, right occipital lobe, and right thalamus. 2. Punctate acute cortical infarct in the posterior left frontal lobe. 3. Late subacute to chronic right cerebellar PICA territory infarct. 4. Small, chronic right parietal infarct, new from prior MRI. 5. Chronic left occipital infarct. 6. No large vessel  occlusion. 7. Severe proximal left PCA stenosis. Moderate right P2 PCA stenosis. Bilateral PCA branch vessel irregular narrowing. 8. 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 Electronically Signed   By: Logan Bores M.D.   On: 11/02/2015 17:55   Mr Brain Wo Contrast  11/02/2015  CLINICAL DATA:  TIA. Acute onset speech difficulty and right-sided weakness. EXAM: MRI HEAD WITHOUT CONTRAST MRA HEAD WITHOUT CONTRAST TECHNIQUE: Multiplanar, multiecho pulse sequences of the brain and surrounding structures were obtained without intravenous contrast. Angiographic images of the head were obtained using MRA technique without contrast. COMPARISON:  Head CT 11/02/2015 and MRI 08/11/2014 FINDINGS: MRI HEAD FINDINGS There is a moderate-sized, late subacute to chronic right cerebellar PICA territory infarct, new from the prior MRI. There are small acute infarcts involving the right hippocampus, right occipital lobe, and right thalamus with a punctate acute infarct also noted along the anteroinferior margin of the atrium of the right lateral ventricle. A punctate acute cortical infarct is also noted in the posterior left frontal lobe. There is a small right parietal cortical infarct which is new from the prior MRI but chronic. There is also a chronic medial left occipital lobe infarct, acute on the prior MRI. Areas of laminar necrosis/ blood breakdown products are noted associated with the cerebellar, left occipital, and right parietal infarcts. There is no evidence of mass, midline shift, or extra-axial fluid collection. Mild global cerebral atrophy is noted. Orbits are unremarkable. Paranasal sinuses and mastoid air cells are clear. Major intracranial vascular flow voids are preserved. MRA HEAD FINDINGS Mildly motion degraded examination. The visualized distal vertebral arteries are patent with the left being dominant. PICA and SCA origins are patent although there is evidence of mild-to-moderate right and high-grade  left proximal SCA stenoses. Basilar artery is patent without stenosis. Posterior communicating arteries are not clearly identified. There is a severe proximal left PCA stenosis near the P1 - P2 junction with irregularity and attenuation of the left PCA distal to this. Right P1 segment is widely patent. There is moderate narrowing of the proximal right P2 segment with attenuation and irregularity of the right PCA more distally. The internal carotid arteries are patent from skullbase to carotid termini with minimal left supraclinoid ICA narrowing. There is a 1.5 mm inferiorly directed outpouching from the proximal right supraclinoid ICA. ACAs and MCAs are patent without evidence of significant proximal stenosis or sizable branch vessel occlusion. Mild MCA branch vessel irregularity is present bilaterally. IMPRESSION: 1. Acute posterior circulation infarcts involving the right hippocampus, right occipital lobe, and right thalamus. 2. Punctate acute cortical infarct in the posterior left frontal lobe. 3. Late subacute to chronic right cerebellar PICA territory infarct. 4. Small, chronic right parietal  infarct, new from prior MRI. 5. Chronic left occipital infarct. 6. No large vessel occlusion. 7. Severe proximal left PCA stenosis. Moderate right P2 PCA stenosis. Bilateral PCA branch vessel irregular narrowing. 8. 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 Electronically Signed   By: Logan Bores M.D.   On: 11/02/2015 17:55    TTE 11/03/15  Study Conclusions  - Left ventricle: The cavity size was normal. Systolic function was normal. The estimated ejection fraction was in the range of 60% to 65%. Wall motion was normal; there were no regional wall motion abnormalities. There was an increased relative contribution of atrial contraction to ventricular filling. Doppler parameters are consistent with abnormal left ventricular relaxation (grade 1 diastolic dysfunction). - Mitral valve: Calcified  annulus. Moderate focal thickening and calcification of the anterior leaflet. - Tricuspid valve: There was trivial regurgitation.  TEE 11/04/15 Study Conclusions  - Left ventricle: Systolic function was normal. The estimated ejection fraction was in the range of 60% to 65%. Wall motion was normal; there were no regional wall motion abnormalities. - Mitral valve: Mildly calcified annulus. Moderately thickened anterior leaflet. - Left atrium: No evidence of thrombus in the atrial cavity or appendage. No evidence of thrombus in the appendage. - Right atrium: No evidence of thrombus in the atrial cavity or appendage. No evidence of thrombus in the appendage. - Atrial septum: There was increased thickness of the septum, consistent with lipomatous hypertrophy. No defect or patent foramen ovale was identified by color Doppler or saline microcavitation study. There was an atrial septal aneurysm. - Pulmonic valve: No evidence of vegetation.  Admission HPI: Ms. CARRYE MAJESKI is a 81 y.o. female w/ PMHx of HTN, HLD, previous CVA/TIA, and ?endometrial CA s/p hysterectomy years ago presents to the ED as a code stroke for acute onset of speech difficulty and right-sided weakness. Per the patient, she was having word finding difficulty and was feeling very unsteady on her feet. She otherwise couldn't pinpoint any other issues she was having but states she knew something wasn't right. Per chart review, she was having slurred speech, comprehension issues, right facial droop, and right-sided weakness. The patient apparently lives alone in a house and is looked after very closely by one of her two daughters. Patient was last known well this AM at 6:30. She has a previous history of stroke/TIA and has been taking ASA 81 mg daily for some time. CT performed in the ED shows no acute abnormalities. When examined in the ED, patient states she is feeling much better, does not feel she is having any  weakness, but still feels like she is having some mild difficulty with her speech.   Patient also noted to have mild troponin elevation, however, she denies any symptoms of SOB, chest pain or discomfort, dizziness, lightheadedness, or palpitations. No previous cardiac issues.   Hospital Course by problem list: Active Problems:   TIA (transient ischemic attack)   Ischemic stroke ([place])   History of stroke   HLD (hyperlipidemia)   Essential hypertension   Recurrent TIA: Patient presented with symptoms of right-sided weakness, right facial droop, slurred speech, and receptive aphasia at home. Brought to the ED as Code Stroke but patient's symptoms improved significantly. MRI of brain showing acute posterior circulation infarcts involving the right hippocampus, right occipital lobe, and right thalamus. Punctate acute cortical infarct in the posterior left frontal lobe. Late subacute to chronic right cerebellar PICA territory infarct. Small, chronic right parietal infarct, new from prior MRI. Chronic left occipital  infarct. No large vessel occlusion. Severe proximal left PCA stenosis. Moderate right P2 PCA stenosis. Bilateral PCA branch vessel irregular narrowing. 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 The nature of her infarcts pointed to an embolic origin (possibly cardiac - A-fib?). Endocarditis less likely as TTE and TEE did not show any vegetations. Carotid doppers showed 1-39% ICA plaquing. Lower extremity dopplers were negative for DVT or SVT. Patient was continued on her home Aspirin 81 mg and started on Plavix 75 mg daily. She has to be on dual antiplatelet therapy for 3 months and then Plavix alone after that as per neurology recommendation. On the day of discharge, patient did not have any difficulty with her speech, strength and sensation in b/l upper and lower extremities grossly intact. Her daughters stated they were available to give patient 24 hours supervision at home until they  arranged for her to be moved to an assisted living facility. Patient and daughters have been advised to call cardiology on Monday (11/07/15) to set up appointment for a loop monitor. In addition, they have been advised to follow up with PCP within 2 weeks and neurology in 2 months.   Discharge Vitals:   BP 121/66 mmHg  Pulse 80  Temp(Src) 98.8 F (37.1 C) (Oral)  Resp 16  Ht 5\' 6"  (1.676 m)  Wt 152 lb 8.9 oz (69.2 kg)  BMI 24.64 kg/m2  SpO2 97%  Discharge Labs:  No results found for this or any previous visit (from the past 24 hour(s)).  Signed: Shela Leff, MD 11/06/2015, 9:09 AM    Services Ordered on Discharge:  Equipment Ordered on Discharge:

## 2015-11-04 NOTE — Progress Notes (Addendum)
Patient's daughters requesting to speak with CM regarding placement for their mother. Per PT/OT recommendations patient is doing too well for SNF. Daughters do not feel they can take patient home at this time even with home health and private duty aides. Daughters requesting a list of ALF facilities in the [place] area. CM provided them a list and informed them that insurance may only pay a small portion or none of the ALF cost. Daughters voiced understanding and want to call for prices and visit some of the facilities on the list. CM encouraged them to do so and to let staff know when they have a few choices selected so CSW can further assist them. Daughters also concerned about how mother would be transported to the facility and CM informed them we could arrange transportation to the facility with the cost going through insurance and to the patient.

## 2015-11-04 NOTE — Progress Notes (Signed)
STROKE TEAM PROGRESS NOTE   HISTORY Nancy Rowland is an 81 y.o. female with a history of hypertension and hyperlipidemia as well as stroke and TIA, brought to the emergency room and code stroke status following the acute onset of speech changes and right-sided weakness. Speech was described as slurred and unintelligible. She had right facial droop and also had right extremity weakness. She was last known well at 6:30 AM today 11/02/2015. She's been taking aspirin 81 mg per day. CT scan of her head showed no acute intracranial abnormality. Old left occipital encephalomalacia as well as likely old right occipital infarction were noted. NIH stroke score was 2. Patient markedly improved in route to the emergency room. Patient was not administered TPA secondary to Rapidly resolving deficits. She was admitted  for further evaluation and treatment.   SUBJECTIVE (INTERVAL HISTORY) No acute event overnight. Had TEE this am unremarkable.   OBJECTIVE Temp:  [97.9 F (36.6 C)-99.2 F (37.3 C)] 98.3 F (36.8 C) (11/25 0936) Pulse Rate:  [72-99] 80 (11/25 1115) Cardiac Rhythm:  [-] Heart block (11/25 0700) Resp:  [12-20] 14 (11/25 1115) BP: (112-138)/(51-67) 119/59 mmHg (11/25 1115) SpO2:  [94 %-100 %] 94 % (11/25 1115)  CBC:   Recent Labs Lab 11/02/15 0823 11/03/15 0429  WBC 6.7 6.3  NEUTROABS 4.3  --   HGB 12.1  12.6 12.3  HCT 36.2  37.0 36.9  MCV 95.5 94.4  PLT 163 [id number]    Basic Metabolic Panel:   Recent Labs Lab 11/03/15 0429 11/04/15 0255  NA 139 138  K 4.0 4.1  CL 107 108  CO2 24 24  GLUCOSE 92 98  BUN 21* 22*  CREATININE 1.25* 1.23*  CALCIUM 8.5* 8.3*    Lipid Panel:     Component Value Date/Time   CHOL 189 11/03/2015 0429   TRIG 84 11/03/2015 0429   HDL 78 11/03/2015 0429   CHOLHDL 2.4 11/03/2015 0429   VLDL 17 11/03/2015 0429   LDLCALC 94 11/03/2015 0429   HgbA1c: No results found for: HGBA1C Urine Drug Screen:     Component Value Date/Time   LABOPIA NONE  DETECTED 11/02/2015 1036   COCAINSCRNUR NONE DETECTED 11/02/2015 1036   LABBENZ NONE DETECTED 11/02/2015 1036   AMPHETMU NONE DETECTED 11/02/2015 1036   THCU NONE DETECTED 11/02/2015 1036   LABBARB NONE DETECTED 11/02/2015 1036      IMAGING I have personally reviewed the radiological images below and agree with the radiology interpretations. Blue text is my interpretation.  Ct Head Wo Contrast 11/02/2015   1. Old infarcts within the left occipital lobe, right upper parietal-occipital lobe, and right cerebellum. 2. Additional ill-defined low-density area within the lower right occipital lobe is new compared to the head CT of 05/08/2015 suggesting additional interval infarct. This is of uncertain age but favored to be subacute or chronic. 3. No convincing evidence of an acute intracranial abnormality. No hemorrhage.  Mri & Mra Brain Wo Contrast 11/02/2015   1. Acute posterior circulation infarcts involving the right hippocampus, right occipital lobe, and right thalamus. 2. Punctate acute cortical infarct in the posterior left frontal lobe. 3. Late subacute to chronic right cerebellar PICA territory infarct. 4. Small, chronic right parietal infarct, new from prior MRI. 5. Chronic left occipital infarct. 6. No large vessel occlusion. 7. Severe proximal left PCA stenosis. Moderate right P2 PCA stenosis. Bilateral PCA branch vessel irregular narrowing. 8. 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 On my interpretation, left frontal punctate infarct is too  subtle to call.   2D Echocardiogram  11/03/2015 Study Conclusions  - Left ventricle: The cavity size was normal. Systolic function was normal. The estimated ejection fraction was in the range of 60% to 65%. Wall motion was normal; there were no regional wall motion abnormalities. There was an increased relative contribution of atrial contraction to ventricular filling. Doppler parameters are consistent with abnormal left  ventricular relaxation (grade 1 diastolic dysfunction). - Mitral valve: Calcified annulus. Moderate focal thickening and calcification of the anterior leaflet. - Tricuspid valve: There was trivial regurgitation.  TEE - LVEF >55% No LAA or RAA thrombus or mass Mitral annular calcification. No MS Mild TR, trivial PR Negative bubble study.  Carotid Doppler: 1-39% ICA plaquing. Vertebral artery flow is antegrade.   LE venous dopplers: There is no DVT or SVT noted in the bilateral lower extremities.    PHYSICAL EXAM  Temp:  [97.9 F (36.6 C)-99.2 F (37.3 C)] 98.3 F (36.8 C) (11/25 0936) Pulse Rate:  [72-99] 80 (11/25 1115) Resp:  [12-20] 14 (11/25 1115) BP: (112-138)/(51-67) 119/59 mmHg (11/25 1115) SpO2:  [94 %-100 %] 94 % (11/25 1115)  General - Well nourished, well developed, in no apparent distress.  Ophthalmologic - Fundi not visualized due to noncooperation.  Cardiovascular - Regular rate and rhythm.  Mental Status -  Level of arousal and orientation to place, and person were intact, not orientated to the year. Language including expression, naming, repetition, comprehension was assessed and found intact. Fund of Knowledge was assessed and was impaired.  Cranial Nerves II - XII - II - Visual field intact OU. III, IV, VI - Extraocular movements intact. V - Facial sensation intact bilaterally. VII - Facial movement intact bilaterally. VIII - Hearing & vestibular intact bilaterally. X - Palate elevates symmetrically. XI - Chin turning & shoulder shrug intact bilaterally. XII - Tongue protrusion intact.  Motor Strength - The patient's strength was normal in all extremities and pronator drift was absent.  Bulk was normal and fasciculations were absent.   Motor Tone - Muscle tone was assessed at the neck and appendages and was normal.  Reflexes - The patient's reflexes were 1+ in all extremities and she had no pathological reflexes.  Sensory - Light touch,  temperature/pinprick were assessed and were symmetrical.    Coordination - The patient had normal movements in the hands and feet with no ataxia or dysmetria.  Tremor was absent.  Gait and Station - not tested due to safety concerns.   ASSESSMENT/PLAN Ms. Nancy Rowland is a 81 y.o. female with history of HTN, HLD, previous CVA/TIA, breast cancer and uterine fibroids presenting with right sided weakness and speech difficulties. She did not receive IV t-PA due to rapidly resolving deficits.   Stroke:  Dominant right PCA infarcts and small left frontal infarct, felt to be embolic secondary to unknown source  MRI   right PCA infarcts. old right cerebellar PICA infarct. Old right parietal infarct. Old left occipital infarct.   MRA  No large vessel occlusion. Severe proximal left PCA stenosis. Moderate right P2 PCA stenosis.    Carotid Doppler  unremarkable  2D Echo EF 60-65%. No cardiac source of emboli identified.  TEE - 11/04/2015 - no thrombus or mass  LE venous dopplers Preliminary report: There is no DVT or SVT noted in the bilateral lower extremities.   LDL 94  HgbA1c pending  Heparin 5000 units sq tid for VTE prophylaxis Diet NPO time specified  aspirin 81 mg daily prior to  admission, now on aspirin 81 mg daily. Due to intracranial stenosis, recommend dual antiplatelet with ASA and plavix for 3 months and then plavix alone. Ordered.  Patient counseled to be compliant with her antithrombotic medications.  Ongoing aggressive stroke risk factor management Recommend loop recorder or outpatient 30 day cardiac event monitoring to rule out afib.    Therapy recommendations:  HH PT & OT  Disposition:  Pending, return home with [place] therapies  (independent with walking, bathing, feeding. probs with hearing aids and bra hook)  Hypertension  Stable  Permissive hypertension (OK if < 220/120) but gradually normalize in 5-7 days  Hyperlipidemia  Home meds:  pravachol 20, changed  to lipitor 40 in hospital  LDL 94, goal < 70  Continue statin at discharge  Other Stroke Risk Factors  Advanced age  Hx stroke/TIA - Hx of left PCA stroke last year, admitted to [facility], recovered well  Other Active Problems  Hx breast cancer in 80s  Hysterectomy in 80s, fibroid related  Troponin elevation, 0.53, 0.48  AKI, Cr 1.25  Hospital day # 2  Neurology will sign off. Please call with questions. Pt will follow up with Dr. Erlinda Hong at [facility] in about 2 months. Thanks for the consult.  Rosalin Hawking, MD PhD Stroke Neurology 11/04/2015 1:04 PM   To contact Stroke Continuity provider, please refer to [email or web address] After hours, contact General Neurology

## 2015-11-04 NOTE — Progress Notes (Signed)
Subjective: Patient seen and examined at bedside this am. She is resting comfortably in her bed and appears to be happy and in no acute acute distress. She denies having any difficulty with her speech. Denies having any weakness or numbness anywhere in her body. Denies having any CP, SOB, or abdominal pain. States her last bowel movement was 2-3 days ago but still continues to pass flatus.  Objective: Vital signs in last 24 hours: Filed Vitals:   11/03/15 1747 11/03/15 2149 11/04/15 0243 11/04/15 0601  BP: 115/51 113/59 119/67 121/67  Pulse: 99 87 78 83  Temp: 98.5 F (36.9 C) 99 F (37.2 C) 97.9 F (36.6 C) 98.1 F (36.7 C)  TempSrc: Oral Oral Oral Oral  Resp: 15 16 18 18   Height:      Weight:      SpO2: 96% 96% 98% 98%   Weight change:  No intake or output data in the 24 hours ending 11/04/15 0730  Physical Exam: General: Very pleasant elderly caucasian female, alert, cooperative, NAD.   HEENT: PERRL, EOMI. Moist mucus membranes Lungs: Clear to ascultation bilaterally, normal work of respiration, no wheezes, rales, rhonchi Heart: RRR, no murmurs, gallops, or rubs Abdomen: Soft, non-tender, non-distended, BS + Extremities: No cyanosis, clubbing, or edema Neurologic: cranial nerves II-XII grossly intact, strength grossly intact, sensation intact to light touch.   Lab Results: Basic Metabolic Panel:  Recent Labs Lab 11/03/15 0429 11/04/15 0255  NA 139 138  K 4.0 4.1  CL 107 108  CO2 24 24  GLUCOSE 92 98  BUN 21* 22*  CREATININE 1.25* 1.23*  CALCIUM 8.5* 8.3*   Liver Function Tests:  Recent Labs Lab 11/02/15 0823  AST 21  ALT 22  ALKPHOS 55  BILITOT 0.6  PROT 6.3*  ALBUMIN 3.6   CBC:  Recent Labs Lab 11/02/15 0823 11/03/15 0429  WBC 6.7 6.3  NEUTROABS 4.3  --   HGB 12.1  12.6 12.3  HCT 36.2  37.0 36.9  MCV 95.5 94.4  PLT 163 157   Cardiac Enzymes:  Recent Labs Lab 11/02/15 0815 11/02/15 1436 11/02/15 2000  TROPONINI 0.55* 0.53* 0.48*    Fasting Lipid Panel:  Recent Labs Lab 11/03/15 0429  CHOL 189  HDL 78  LDLCALC 94  TRIG 84  CHOLHDL 2.4   Coagulation:  Recent Labs Lab 11/02/15 0823 11/03/15 0429  LABPROT 14.0 14.9  INR 1.06 1.16   Urine Drug Screen: Drugs of Abuse     Component Value Date/Time   LABOPIA NONE DETECTED 11/02/2015 1036   COCAINSCRNUR NONE DETECTED 11/02/2015 1036   LABBENZ NONE DETECTED 11/02/2015 1036   AMPHETMU NONE DETECTED 11/02/2015 1036   THCU NONE DETECTED 11/02/2015 1036   LABBARB NONE DETECTED 11/02/2015 1036    Alcohol Level:  Recent Labs Lab 11/02/15 0823  ETH <5   Urinalysis:  Recent Labs Lab 11/02/15 1036  COLORURINE YELLOW  LABSPEC 1.010  PHURINE 6.0  GLUCOSEU NEGATIVE  HGBUR NEGATIVE  BILIRUBINUR NEGATIVE  KETONESUR NEGATIVE  PROTEINUR NEGATIVE  NITRITE NEGATIVE  LEUKOCYTESUR NEGATIVE   Studies/Results: Ct Head Wo Contrast  11/02/2015  CLINICAL DATA:  Code stroke.  Stroke symptoms. EXAM: CT HEAD WITHOUT CONTRAST TECHNIQUE: Contiguous axial images were obtained from the base of the skull through the vertex without intravenous contrast. COMPARISON:  Head CT dated 05/08/2015. FINDINGS: There is mild generalized brain atrophy with commensurate dilatation of the ventricles and sulci. Mild chronic small vessel ischemic changes again noted within the deep periventricular white matter  regions bilaterally. There is an old infarct with associated encephalomalacia in the lower left occipital lobe. There are additional old infarcts with associated encephalomalacia in the upper right parietal-occipital lobe and right cerebellum. Smaller ill-defined low-density area within the lower right occipital lobe (images 11 through 13 on axial series 201) is new compared to the previous head CT of 05/08/2015, most suggestive of additional subacute-to-chronic infarct. There is no convincing mass, hemorrhage, or other evidence of acute parenchymal abnormality. No extra-axial  hemorrhage. No acute osseous abnormality. Paranasal sinuses are clear. Mastoid air cells are clear. IMPRESSION: 1. Old infarcts within the left occipital lobe, right upper parietal-occipital lobe, and right cerebellum. 2. Additional ill-defined low-density area within the lower right occipital lobe is new compared to the head CT of 05/08/2015 suggesting additional interval infarct. This is of uncertain age but favored to be subacute or chronic. 3. No convincing evidence of an acute intracranial abnormality. No hemorrhage. Critical Value/emergent results were called by telephone at the time of interpretation on 11/02/2015 at 8:40 am to Dr. Tanna Furry , who verbally acknowledged these results. Electronically Signed   By: Franki Cabot M.D.   On: 11/02/2015 08:46   Mr Jodene Nam Head Wo Contrast  11/02/2015  CLINICAL DATA:  TIA. Acute onset speech difficulty and right-sided weakness. EXAM: MRI HEAD WITHOUT CONTRAST MRA HEAD WITHOUT CONTRAST TECHNIQUE: Multiplanar, multiecho pulse sequences of the brain and surrounding structures were obtained without intravenous contrast. Angiographic images of the head were obtained using MRA technique without contrast. COMPARISON:  Head CT 11/02/2015 and MRI 08/11/2014 FINDINGS: MRI HEAD FINDINGS There is a moderate-sized, late subacute to chronic right cerebellar PICA territory infarct, new from the prior MRI. There are small acute infarcts involving the right hippocampus, right occipital lobe, and right thalamus with a punctate acute infarct also noted along the anteroinferior margin of the atrium of the right lateral ventricle. A punctate acute cortical infarct is also noted in the posterior left frontal lobe. There is a small right parietal cortical infarct which is new from the prior MRI but chronic. There is also a chronic medial left occipital lobe infarct, acute on the prior MRI. Areas of laminar necrosis/ blood breakdown products are noted associated with the cerebellar, left  occipital, and right parietal infarcts. There is no evidence of mass, midline shift, or extra-axial fluid collection. Mild global cerebral atrophy is noted. Orbits are unremarkable. Paranasal sinuses and mastoid air cells are clear. Major intracranial vascular flow voids are preserved. MRA HEAD FINDINGS Mildly motion degraded examination. The visualized distal vertebral arteries are patent with the left being dominant. PICA and SCA origins are patent although there is evidence of mild-to-moderate right and high-grade left proximal SCA stenoses. Basilar artery is patent without stenosis. Posterior communicating arteries are not clearly identified. There is a severe proximal left PCA stenosis near the P1 - P2 junction with irregularity and attenuation of the left PCA distal to this. Right P1 segment is widely patent. There is moderate narrowing of the proximal right P2 segment with attenuation and irregularity of the right PCA more distally. The internal carotid arteries are patent from skullbase to carotid termini with minimal left supraclinoid ICA narrowing. There is a 1.5 mm inferiorly directed outpouching from the proximal right supraclinoid ICA. ACAs and MCAs are patent without evidence of significant proximal stenosis or sizable branch vessel occlusion. Mild MCA branch vessel irregularity is present bilaterally. IMPRESSION: 1. Acute posterior circulation infarcts involving the right hippocampus, right occipital lobe, and right thalamus. 2. Punctate  acute cortical infarct in the posterior left frontal lobe. 3. Late subacute to chronic right cerebellar PICA territory infarct. 4. Small, chronic right parietal infarct, new from prior MRI. 5. Chronic left occipital infarct. 6. No large vessel occlusion. 7. Severe proximal left PCA stenosis. Moderate right P2 PCA stenosis. Bilateral PCA branch vessel irregular narrowing. 8. 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 Electronically Signed   By: Logan Bores M.D.    On: 11/02/2015 17:55   Mr Brain Wo Contrast  11/02/2015  CLINICAL DATA:  TIA. Acute onset speech difficulty and right-sided weakness. EXAM: MRI HEAD WITHOUT CONTRAST MRA HEAD WITHOUT CONTRAST TECHNIQUE: Multiplanar, multiecho pulse sequences of the brain and surrounding structures were obtained without intravenous contrast. Angiographic images of the head were obtained using MRA technique without contrast. COMPARISON:  Head CT 11/02/2015 and MRI 08/11/2014 FINDINGS: MRI HEAD FINDINGS There is a moderate-sized, late subacute to chronic right cerebellar PICA territory infarct, new from the prior MRI. There are small acute infarcts involving the right hippocampus, right occipital lobe, and right thalamus with a punctate acute infarct also noted along the anteroinferior margin of the atrium of the right lateral ventricle. A punctate acute cortical infarct is also noted in the posterior left frontal lobe. There is a small right parietal cortical infarct which is new from the prior MRI but chronic. There is also a chronic medial left occipital lobe infarct, acute on the prior MRI. Areas of laminar necrosis/ blood breakdown products are noted associated with the cerebellar, left occipital, and right parietal infarcts. There is no evidence of mass, midline shift, or extra-axial fluid collection. Mild global cerebral atrophy is noted. Orbits are unremarkable. Paranasal sinuses and mastoid air cells are clear. Major intracranial vascular flow voids are preserved. MRA HEAD FINDINGS Mildly motion degraded examination. The visualized distal vertebral arteries are patent with the left being dominant. PICA and SCA origins are patent although there is evidence of mild-to-moderate right and high-grade left proximal SCA stenoses. Basilar artery is patent without stenosis. Posterior communicating arteries are not clearly identified. There is a severe proximal left PCA stenosis near the P1 - P2 junction with irregularity and  attenuation of the left PCA distal to this. Right P1 segment is widely patent. There is moderate narrowing of the proximal right P2 segment with attenuation and irregularity of the right PCA more distally. The internal carotid arteries are patent from skullbase to carotid termini with minimal left supraclinoid ICA narrowing. There is a 1.5 mm inferiorly directed outpouching from the proximal right supraclinoid ICA. ACAs and MCAs are patent without evidence of significant proximal stenosis or sizable branch vessel occlusion. Mild MCA branch vessel irregularity is present bilaterally. IMPRESSION: 1. Acute posterior circulation infarcts involving the right hippocampus, right occipital lobe, and right thalamus. 2. Punctate acute cortical infarct in the posterior left frontal lobe. 3. Late subacute to chronic right cerebellar PICA territory infarct. 4. Small, chronic right parietal infarct, new from prior MRI. 5. Chronic left occipital infarct. 6. No large vessel occlusion. 7. Severe proximal left PCA stenosis. Moderate right P2 PCA stenosis. Bilateral PCA branch vessel irregular narrowing. 8. 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 Electronically Signed   By: Logan Bores M.D.   On: 11/02/2015 17:55   Medications: I have reviewed the patient's current medications. Scheduled Meds: . ARIPiprazole  5 mg Oral Daily  . aspirin EC  81 mg Oral Daily  . atorvastatin  40 mg Oral q1800  . busPIRone  20 mg Oral BID  .  clopidogrel  75 mg Oral Daily  . heparin  5,000 Units Subcutaneous 3 times per day  . sodium chloride  3 mL Intravenous Q12H  . venlafaxine XR  150 mg Oral Q breakfast   Continuous Infusions:  PRN Meds:. Assessment/Plan: Active Problems:   TIA (transient ischemic attack)   Stroke (HCC)   History of stroke   HLD (hyperlipidemia)   Essential hypertension  Recurrent TIA: Patient presented with symptoms of right-sided weakness, right facial droop, slurred speech, and receptive aphasia at  home. Brought to the ED as Code Stroke but patient's symptoms improved significantly. MRI of brain showing acute posterior circulation infarcts involving the right hippocampus, right occipital lobe, and right thalamus. Punctate acute cortical infarct in the posterior left frontal lobe. Late subacute to chronic right cerebellar PICA territory infarct. Small, chronic right parietal infarct, new from prior MRI. Chronic left occipital infarct. No large vessel occlusion. Severe proximal left PCA stenosis. Moderate right P2 PCA stenosis. Bilateral PCA branch vessel irregular narrowing. 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  The nature of her infarcts points to an embolic origin of unknown source (possibly cardiac? - afib vs endocarditis). If no vegetations or afib, next step would be workup for vasculitis. On exam today, she is not having any difficulty with her speech, strength and sensation in b/l upper and lower extremities grossly intact. Carotid doppers showing 1-39% ICA plaquing. Lower extremity dopplers negative for DVT or SVT. TTE showing LVEF 60-65% and no vegetations noted.   -Monitor on telemetry  -Aspirin 81 mg daily + Plavix 75 mg daily for 3 months. Then Plavix alone as per neuro recs.  -TEE ordered   -HbA1c pending  -Hold Losartan for permissive HTN -will need outpatient 30 day cardiac event monitoring to rule out A-fib  -HHPT/ OT  Troponin Elevation: Troponin 0.55 on admission, EKG with no ischemic changes. Suspect this is related to CVA/TIA vs demand ischemia. Patient also with a mild AKI as well. She denies any SOB, chest pain, palpitations, dizziness, or lightheadedness. Could also be related to a cardioembolic event (?atrial fibrillation) which involved the coronaries and brain. Troponin trended down to 0.48.  -Telemetry  -Continue ASA as above  AKI: Mild, although her baseline is not known. Scr down to 1.23 today, was 1.44 on admission.  -encourage PO intake  -BMP in  AM  Constipation: Senokot-S  HLD: Atorvastatin 40 mg daily   Diet: regular, thin liquids   DVT/PE PPx: Heparin 5000u SQ TID  Dispo: Disposition is deferred at this time, awaiting improvement of current medical problems.  Anticipated discharge in approximately 1-2 day(s).   The patient does have a current PCP (No primary care provider on file.) and does need an [facility] hospital follow-up appointment after discharge.  The patient does not have transportation limitations that hinder transportation to clinic appointments.  .Services Needed at time of discharge: Y = Yes, Blank = No PT:   OT:   RN:   Equipment:   Other:     LOS: 2 days   Shela Leff, MD 11/04/2015, 7:30 AM

## 2015-11-04 NOTE — CV Procedure (Signed)
Brief TEE Note  LVEF >55% No LAA or RAA thrombus or mass Mitral annular calcification.  No MS Mild TR, trivial PR Negative bubble study.   For additional details see full report.  Malasia Torain C. Oval Linsey, MD  11/04/2015 11:06 AM

## 2015-11-04 NOTE — Progress Notes (Signed)
    [facility] has been requested to perform a transesophageal echocardiogram on Nancy Rowland  for acute posterior circulation infarcts.  After careful review of history and examination, the risks and benefits of transesophageal echocardiogram have been explained including risks of esophageal damage, perforation (1:10,000 risk), bleeding, pharyngeal hematoma as well as other potential complications associated with conscious sedation including aspiration, arrhythmia, respiratory failure and death. Alternatives to treatment were discussed, questions were answered. Patient is willing to proceed.   BP has been 113/51 - 121/67 in the past 24 hours. No requirement for pressors. Hbg stable at 12.3. Platelets at 157.  Erma Heritage, PA-C 11/04/2015 8:22 AM

## 2015-11-04 NOTE — Progress Notes (Signed)
Echocardiogram 2D Echocardiogram has been performed.  Nancy Rowland 11/04/2015, 12:03 PM

## 2015-11-04 NOTE — Progress Notes (Signed)
PT Cancellation Note  Patient Details Name: Nancy Rowland MRN: [id number] DOB: 08/16/1929   Cancelled Treatment:    Reason Eval/Treat Not Completed: Other (comment) (Pt unavailable with meal then a bath when PT reattempted).  Will not be able to see as she is discharging home after bath.   Ramond Dial 11/04/2015, 3:30 PM   Mee Hives, PT MS Acute Rehab Dept. Number: [facility] [id number] and [place] [phone]

## 2015-11-04 NOTE — Discharge Instructions (Signed)
You were admitted with a stroke.  You will need to follow up with the stroke clinic in 2 months.   Take Aspirin and Plavix every day from now. Also take Lipitor.  You will need to call the Cardiology office to have a cardiac monitor called "loop recorder" put in outpatient.

## 2015-11-04 NOTE — Interval H&P Note (Signed)
History and Physical Interval Note:  11/04/2015 10:36 AM  Nancy Rowland  has presented today for surgery, with the diagnosis of stroke  The various methods of treatment have been discussed with the patient and family. After consideration of risks, benefits and other options for treatment, the patient has consented to  Procedure(s): TRANSESOPHAGEAL ECHOCARDIOGRAM (TEE) (N/A) as a surgical intervention .  The patient's history has been reviewed, patient examined, no change in status, stable for surgery.  I have reviewed the patient's chart and labs.  Questions were answered to the patient's satisfaction.     Sharol Harness, MD 11/04/2015 10:36 AM

## 2015-11-06 NOTE — Discharge Summary (Signed)
Name: Nancy Rowland MRN: [id number] DOB: [birth year] 81 y.o. PCP: No primary care provider on file.  Date of Admission: 11/02/2015  8:15 AM Date of Discharge: 11/06/2015 Attending Physician: No att. providers found  Discharge Diagnosis:  Active Problems:   TIA (transient ischemic attack)   Ischemic stroke ([place])   History of stroke   HLD (hyperlipidemia)   Essential hypertension  Discharge Medications:   Medication List    STOP taking these medications        pravastatin 20 MG tablet  Commonly known as:  PRAVACHOL      TAKE these medications        ARIPiprazole 5 MG tablet  Commonly known as:  ABILIFY  Take 5 mg by mouth daily.     aspirin EC 81 MG tablet  Take 81 mg by mouth daily.     atorvastatin 40 MG tablet  Commonly known as:  LIPITOR  Take 1 tablet (40 mg total) by mouth daily at 6 PM.     busPIRone 10 MG tablet  Commonly known as:  BUSPAR  Take 20 mg by mouth 2 (two) times daily.     clopidogrel 75 MG tablet  Commonly known as:  PLAVIX  Take 1 tablet (75 mg total) by mouth daily.     desvenlafaxine 100 MG 24 hr tablet  Commonly known as:  PRISTIQ  Take 100 mg by mouth daily.     losartan 25 MG tablet  Commonly known as:  COZAAR  Take 25 mg by mouth daily.     senna-docusate 8.6-50 MG tablet  Commonly known as:  Senokot-S  Take 1 tablet by mouth at bedtime.        Disposition and follow-up:   Nancy Rowland was discharged from [facility] in Stable condition.  At the hospital follow up visit please address:  1. - Patient needs to follow up with cardiology as soon and possible for a loop monitor to r/o A-fib.  - She is to take Aspirin 81 mg + Plavix 75 mg daily for 3 months and then Plavix alone after that as per neurology recommendation.   - Switched from home medication low intensity statin (Pravastatin 20 mg qd) to high intensity stain (Atorvastatin 40 mg qd).   2.  Labs / imaging needed at time of follow-up:   3.   Pending labs/ test needing follow-up:  Follow-up Appointments: Follow-up Information    Schedule an appointment as soon as possible for a visit with Cristopher Peru, MD.   Specialty:  Cardiology   Why:  offic closed for holiday   Contact information:   [address] [place] [address] [phone]       Follow up with Xu,Jindong, MD. Schedule an appointment as soon as possible for a visit in 2 months.   Specialty:  Neurology   Why:  Follow up, stroke clinic,office closed for holiday   Contact information:   [address] [place] [address] [phone]       Follow up with PROCHNAU,CAROLINE, MD. Schedule an appointment as soon as possible for a visit in 2 weeks.   Specialty:  Internal Medicine   Why:  Follow up,office closed for holiday   Contact information:   [address]  [place] [address] [phone]       Please follow up.   Why:  attempt to make appt,office closed      Discharge Instructions: Discharge Instructions  Ambulatory referral to Neurology    Complete by:  As directed   An appointment is requested in approximately: 8 weeks. For stroke follow up with Dr. Erlinda Hong in [facility].           Consultations:    Procedures Performed:  Ct Head Wo Contrast  11/02/2015  CLINICAL DATA:  Code stroke.  Stroke symptoms. EXAM: CT HEAD WITHOUT CONTRAST TECHNIQUE: Contiguous axial images were obtained from the base of the skull through the vertex without intravenous contrast. COMPARISON:  Head CT dated 05/08/2015. FINDINGS: There is mild generalized brain atrophy with commensurate dilatation of the ventricles and sulci. Mild chronic small vessel ischemic changes again noted within the deep periventricular white matter regions bilaterally. There is an old infarct with associated encephalomalacia in the lower left occipital lobe. There are additional old infarcts with associated encephalomalacia in the upper right parietal-occipital lobe and  right cerebellum. Smaller ill-defined low-density area within the lower right occipital lobe (images 11 through 13 on axial series 201) is new compared to the previous head CT of 05/08/2015, most suggestive of additional subacute-to-chronic infarct. There is no convincing mass, hemorrhage, or other evidence of acute parenchymal abnormality. No extra-axial hemorrhage. No acute osseous abnormality. Paranasal sinuses are clear. Mastoid air cells are clear. IMPRESSION: 1. Old infarcts within the left occipital lobe, right upper parietal-occipital lobe, and right cerebellum. 2. Additional ill-defined low-density area within the lower right occipital lobe is new compared to the head CT of 05/08/2015 suggesting additional interval infarct. This is of uncertain age but favored to be subacute or chronic. 3. No convincing evidence of an acute intracranial abnormality. No hemorrhage. Critical Value/emergent results were called by telephone at the time of interpretation on 11/02/2015 at 8:40 am to Dr. Tanna Furry , who verbally acknowledged these results. Electronically Signed   By: Franki Cabot M.D.   On: 11/02/2015 08:46   Mr Nancy Rowland Head Wo Contrast  11/02/2015  CLINICAL DATA:  TIA. Acute onset speech difficulty and right-sided weakness. EXAM: MRI HEAD WITHOUT CONTRAST MRA HEAD WITHOUT CONTRAST TECHNIQUE: Multiplanar, multiecho pulse sequences of the brain and surrounding structures were obtained without intravenous contrast. Angiographic images of the head were obtained using MRA technique without contrast. COMPARISON:  Head CT 11/02/2015 and MRI 08/11/2014 FINDINGS: MRI HEAD FINDINGS There is a moderate-sized, late subacute to chronic right cerebellar PICA territory infarct, new from the prior MRI. There are small acute infarcts involving the right hippocampus, right occipital lobe, and right thalamus with a punctate acute infarct also noted along the anteroinferior margin of the atrium of the right lateral ventricle. A  punctate acute cortical infarct is also noted in the posterior left frontal lobe. There is a small right parietal cortical infarct which is new from the prior MRI but chronic. There is also a chronic medial left occipital lobe infarct, acute on the prior MRI. Areas of laminar necrosis/ blood breakdown products are noted associated with the cerebellar, left occipital, and right parietal infarcts. There is no evidence of mass, midline shift, or extra-axial fluid collection. Mild global cerebral atrophy is noted. Orbits are unremarkable. Paranasal sinuses and mastoid air cells are clear. Major intracranial vascular flow voids are preserved. MRA HEAD FINDINGS Mildly motion degraded examination. The visualized distal vertebral arteries are patent with the left being dominant. PICA and SCA origins are patent although there is evidence of mild-to-moderate right and high-grade left proximal SCA stenoses. Basilar artery is patent without stenosis. Posterior communicating arteries are not clearly identified. There is a  severe proximal left PCA stenosis near the P1 - P2 junction with irregularity and attenuation of the left PCA distal to this. Right P1 segment is widely patent. There is moderate narrowing of the proximal right P2 segment with attenuation and irregularity of the right PCA more distally. The internal carotid arteries are patent from skullbase to carotid termini with minimal left supraclinoid ICA narrowing. There is a 1.5 mm inferiorly directed outpouching from the proximal right supraclinoid ICA. ACAs and MCAs are patent without evidence of significant proximal stenosis or sizable branch vessel occlusion. Mild MCA branch vessel irregularity is present bilaterally. IMPRESSION: 1. Acute posterior circulation infarcts involving the right hippocampus, right occipital lobe, and right thalamus. 2. Punctate acute cortical infarct in the posterior left frontal lobe. 3. Late subacute to chronic right cerebellar PICA  territory infarct. 4. Small, chronic right parietal infarct, new from prior MRI. 5. Chronic left occipital infarct. 6. No large vessel occlusion. 7. Severe proximal left PCA stenosis. Moderate right P2 PCA stenosis. Bilateral PCA branch vessel irregular narrowing. 8. 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 Electronically Signed   By: Logan Bores M.D.   On: 11/02/2015 17:55   Mr Brain Wo Contrast  11/02/2015  CLINICAL DATA:  TIA. Acute onset speech difficulty and right-sided weakness. EXAM: MRI HEAD WITHOUT CONTRAST MRA HEAD WITHOUT CONTRAST TECHNIQUE: Multiplanar, multiecho pulse sequences of the brain and surrounding structures were obtained without intravenous contrast. Angiographic images of the head were obtained using MRA technique without contrast. COMPARISON:  Head CT 11/02/2015 and MRI 08/11/2014 FINDINGS: MRI HEAD FINDINGS There is a moderate-sized, late subacute to chronic right cerebellar PICA territory infarct, new from the prior MRI. There are small acute infarcts involving the right hippocampus, right occipital lobe, and right thalamus with a punctate acute infarct also noted along the anteroinferior margin of the atrium of the right lateral ventricle. A punctate acute cortical infarct is also noted in the posterior left frontal lobe. There is a small right parietal cortical infarct which is new from the prior MRI but chronic. There is also a chronic medial left occipital lobe infarct, acute on the prior MRI. Areas of laminar necrosis/ blood breakdown products are noted associated with the cerebellar, left occipital, and right parietal infarcts. There is no evidence of mass, midline shift, or extra-axial fluid collection. Mild global cerebral atrophy is noted. Orbits are unremarkable. Paranasal sinuses and mastoid air cells are clear. Major intracranial vascular flow voids are preserved. MRA HEAD FINDINGS Mildly motion degraded examination. The visualized distal vertebral arteries are  patent with the left being dominant. PICA and SCA origins are patent although there is evidence of mild-to-moderate right and high-grade left proximal SCA stenoses. Basilar artery is patent without stenosis. Posterior communicating arteries are not clearly identified. There is a severe proximal left PCA stenosis near the P1 - P2 junction with irregularity and attenuation of the left PCA distal to this. Right P1 segment is widely patent. There is moderate narrowing of the proximal right P2 segment with attenuation and irregularity of the right PCA more distally. The internal carotid arteries are patent from skullbase to carotid termini with minimal left supraclinoid ICA narrowing. There is a 1.5 mm inferiorly directed outpouching from the proximal right supraclinoid ICA. ACAs and MCAs are patent without evidence of significant proximal stenosis or sizable branch vessel occlusion. Mild MCA branch vessel irregularity is present bilaterally. IMPRESSION: 1. Acute posterior circulation infarcts involving the right hippocampus, right occipital lobe, and right thalamus. 2. Punctate acute cortical  infarct in the posterior left frontal lobe. 3. Late subacute to chronic right cerebellar PICA territory infarct. 4. Small, chronic right parietal infarct, new from prior MRI. 5. Chronic left occipital infarct. 6. No large vessel occlusion. 7. Severe proximal left PCA stenosis. Moderate right P2 PCA stenosis. Bilateral PCA branch vessel irregular narrowing. 8. 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 Electronically Signed   By: Logan Bores M.D.   On: 11/02/2015 17:55    TTE 11/03/15  Study Conclusions  - Left ventricle: The cavity size was normal. Systolic function was normal. The estimated ejection fraction was in the range of 60% to 65%. Wall motion was normal; there were no regional wall motion abnormalities. There was an increased relative contribution of atrial contraction to ventricular  filling. Doppler parameters are consistent with abnormal left ventricular relaxation (grade 1 diastolic dysfunction). - Mitral valve: Calcified annulus. Moderate focal thickening and calcification of the anterior leaflet. - Tricuspid valve: There was trivial regurgitation.  TEE 11/04/15 Study Conclusions  - Left ventricle: Systolic function was normal. The estimated ejection fraction was in the range of 60% to 65%. Wall motion was normal; there were no regional wall motion abnormalities. - Mitral valve: Mildly calcified annulus. Moderately thickened anterior leaflet. - Left atrium: No evidence of thrombus in the atrial cavity or appendage. No evidence of thrombus in the appendage. - Right atrium: No evidence of thrombus in the atrial cavity or appendage. No evidence of thrombus in the appendage. - Atrial septum: There was increased thickness of the septum, consistent with lipomatous hypertrophy. No defect or patent foramen ovale was identified by color Doppler or saline microcavitation study. There was an atrial septal aneurysm. - Pulmonic valve: No evidence of vegetation.  Admission HPI: Nancy Rowland is a 81 y.o. female w/ PMHx of HTN, HLD, previous CVA/TIA, and ?endometrial CA s/p hysterectomy years ago presents to the ED as a code stroke for acute onset of speech difficulty and right-sided weakness. Per the patient, she was having word finding difficulty and was feeling very unsteady on her feet. She otherwise couldn't pinpoint any other issues she was having but states she knew something wasn't right. Per chart review, she was having slurred speech, comprehension issues, right facial droop, and right-sided weakness. The patient apparently lives alone in a house and is looked after very closely by one of her two daughters. Patient was last known well this AM at 6:30. She has a previous history of stroke/TIA and has been taking ASA 81 mg daily for some time. CT  performed in the ED shows no acute abnormalities. When examined in the ED, patient states she is feeling much better, does not feel she is having any weakness, but still feels like she is having some mild difficulty with her speech.   Patient also noted to have mild troponin elevation, however, she denies any symptoms of SOB, chest pain or discomfort, dizziness, lightheadedness, or palpitations. No previous cardiac issues.   Hospital Course by problem list: Active Problems:   TIA (transient ischemic attack)   Ischemic stroke ([place])   History of stroke   HLD (hyperlipidemia)   Essential hypertension   Recurrent TIA: Patient presented with symptoms of right-sided weakness, right facial droop, slurred speech, and receptive aphasia at home. Brought to the ED as Code Stroke but patient's symptoms improved significantly. MRI of brain showing acute posterior circulation infarcts involving the right hippocampus, right occipital lobe, and right thalamus. Punctate acute cortical infarct in the posterior left frontal  lobe. Late subacute to chronic right cerebellar PICA territory infarct. Small, chronic right parietal infarct, new from prior MRI. Chronic left occipital infarct. No large vessel occlusion. Severe proximal left PCA stenosis. Moderate right P2 PCA stenosis. Bilateral PCA branch vessel irregular narrowing. 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 The nature of her infarcts pointed to an embolic origin (possibly cardiac - A-fib?). Endocarditis less likely as TTE and TEE did not show any vegetations. Carotid doppers showed 1-39% ICA plaquing. Lower extremity dopplers were negative for DVT or SVT. Patient was continued on her home Aspirin 81 mg and started on Plavix 75 mg daily. She has to be on dual antiplatelet therapy for 3 months and then Plavix alone after that as per neurology recommendation. She was switched from home medication low intensity statin (Pravastatin 20 mg qd) to high intensity  stain (Atorvastatin 40 mg qd). On the day of discharge, patient did not have any difficulty with her speech, strength and sensation in b/l upper and lower extremities grossly intact. Her daughters stated they were available to give patient 24 hours supervision at home until they arranged for her to be moved to an assisted living facility. Patient and daughters have been advised to call cardiology on Monday (11/07/15) to set up appointment for a loop monitor. In addition, they have been advised to follow up with PCP within 2 weeks and neurology in 2 months.   Discharge Vitals:   BP 121/66 mmHg  Pulse 80  Temp(Src) 98.8 F (37.1 C) (Oral)  Resp 16  Ht 5\' 6"  (1.676 m)  Wt 152 lb 8.9 oz (69.2 kg)  BMI 24.64 kg/m2  SpO2 97%  Discharge Labs:  No results found for this or any previous visit (from the past 24 hour(s)).  Signed: Shela Leff, MD 11/06/2015, 9:09 AM    Services Ordered on Discharge:  Equipment Ordered on Discharge:

## 2015-11-07 ENCOUNTER — Encounter (HOSPITAL_COMMUNITY): Payer: Self-pay | Admitting: Cardiovascular Disease

## 2015-11-17 ENCOUNTER — Institutional Professional Consult (permissible substitution): Payer: Medicare Other | Admitting: Internal Medicine

## 2015-12-05 ENCOUNTER — Ambulatory Visit (INDEPENDENT_AMBULATORY_CARE_PROVIDER_SITE_OTHER): Payer: Medicare Other | Admitting: Family Medicine

## 2015-12-05 VITALS — BP 130/80 | HR 117 | Temp 97.9°F | Resp 14 | Ht 64.0 in | Wt 146.2 lb

## 2015-12-05 DIAGNOSIS — B029 Zoster without complications: Secondary | ICD-10-CM | POA: Diagnosis not present

## 2015-12-05 MED ORDER — VALACYCLOVIR HCL 1 G PO TABS: 1000.0000 mg | ORAL_TABLET | Freq: Three times a day (TID) | ORAL | Status: DC

## 2015-12-05 NOTE — Progress Notes (Signed)
@UMFCLOGO @  By signing my name below, I, Nancy Rowland, attest that this documentation has been prepared under the direction and in the presence of Nancy Haber, MD.  Electronically Signed: Thea Rowland, ED Scribe. 12/05/2015. 2:46 PM.  Patient ID: [name] MRN: [id number], DOB: Oct 24, 1929, 81 y.o. Date of Encounter: 12/05/2015, 2:45 PM  Primary Physician: No primary care provider on file.  Chief Complaint:  Chief Complaint  Patient presents with  . Rash    Left side of neck, possible shingles./ Written PRESCRIPTION needed.    HPI: 81 y.o. year old female with history below presents with an erythematous rash along left side of neck that began 4 days ago. Per daughter, rash initially started in Row[place] patches. States blisters appeared the following day and have gradually grown in size. Pt states area is tender and uncomfortable. Pt received shingle vaccine 2-3 years ago.    Past Medical History  Diagnosis Date  . Cancer ([place])   . Stroke [facility]) 08/2014     Home Meds: Prior to Admission medications   Medication Sig Start Date End Date Taking? Authorizing Provider  ARIPiprazole (ABILIFY) 5 MG tablet Take 5 mg by mouth daily.   Yes Historical Provider, MD  aspirin EC 81 MG tablet Take 81 mg by mouth daily.   Yes Historical Provider, MD  atorvastatin (LIPITOR) 40 MG tablet Take 1 tablet (40 mg total) by mouth daily at 6 PM. 11/04/15  Yes Tasrif Ahmed, MD  busPIRone (BUSPAR) 10 MG tablet Take 20 mg by mouth 2 (two) times daily.   Yes Historical Provider, MD  clopidogrel (PLAVIX) 75 MG tablet Take 1 tablet (75 mg total) by mouth daily. 11/04/15  Yes Tasrif Ahmed, MD  desvenlafaxine (PRISTIQ) 100 MG 24 hr tablet Take 100 mg by mouth daily.   Yes Historical Provider, MD  losartan (COZAAR) 25 MG tablet Take 25 mg by mouth daily.   Yes Historical Provider, MD  senna-docusate (SENOKOT-S) 8.6-50 MG tablet Take 1 tablet by mouth at bedtime. 11/04/15  Yes Dellia Nims, MD    Allergies: No  Known Allergies  Social History   Social History  . Marital Status: Married    Spouse Name: N/A  . Number of Children: N/A  . Years of Education: N/A   Occupational History  . Not on file.   Social History Main Topics  . Smoking status: Never Smoker   . Smokeless tobacco: Not on file  . Alcohol Use: Not on file  . Drug Use: Not on file  . Sexual Activity: Not on file   Other Topics Concern  . Not on file   Social History Narrative     Review of Systems: Constitutional: negative for chills, fever, night sweats, weight changes, or fatigue  HEENT: negative for vision changes, hearing loss, congestion, rhinorrhea, ST, epistaxis, or sinus pressure Cardiovascular: negative for chest pain or palpitations Respiratory: negative for hemoptysis, wheezing, shortness of breath, or cough Abdominal: negative for abdominal pain, nausea, vomiting, diarrhea, or constipation Dermatological: positive for rash Neurologic: negative for headache, dizziness, or syncope All other systems reviewed and are otherwise negative with the exception to those above and in the HPI.   Physical Exam: Blood pressure 130/80, pulse 117, temperature 97.9 F (36.6 C), temperature source Oral, resp. rate 14, height 5\' 4"  (1.626 m), weight 146 lb 3.2 oz (66.316 kg), SpO2 95 %., Body mass index is 25.08 kg/(m^2). General: Well developed, well nourished, in no acute distress. Head: Normocephalic, atraumatic, eyes without discharge, sclera non-icteric,  nares are without discharge. Bilateral auditory canals clear, TM's are without perforation, pearly grey and translucent with reflective cone of light bilaterally. Oral cavity moist, posterior pharynx without exudate, erythema, peritonsillar abscess, or post nasal drip.  Neck: Supple. No thyromegaly. Full ROM. No lymphadenopathy. Msk:  Strength and tone normal for age. Extremities/Skin: Warm and dry. No clubbing or cyanosis. No edema. No rashes or suspicious lesions.  Vesicular dermatomic rash left neck. Neuro: Alert and oriented X 3. Moves all extremities spontaneously. Gait is normal. CNII-XII grossly in tact. Psych:  Responds to questions appropriately with a normal affect.     ASSESSMENT AND PLAN:  81 y.o. year old female with shingles despite having received the vaccine    ICD-9-CM ICD-10-CM   1. Shingles rash 053.9 B02.9 valACYclovir (VALTREX) 1000 MG tablet   This chart was scribed in my presence and reviewed by me personally.  Signed, Nancy Haber, MD 12/05/2015 2:45 PM

## 2015-12-05 NOTE — Patient Instructions (Signed)

## 2016-02-13 ENCOUNTER — Ambulatory Visit: Payer: Medicare Other | Admitting: Neurology

## 2016-02-14 ENCOUNTER — Encounter: Payer: Self-pay | Admitting: Neurology

## 2017-01-07 ENCOUNTER — Emergency Department (HOSPITAL_COMMUNITY): Payer: Medicare Other

## 2017-01-07 ENCOUNTER — Encounter (HOSPITAL_COMMUNITY): Payer: Self-pay

## 2017-01-07 ENCOUNTER — Emergency Department (HOSPITAL_COMMUNITY)
Admission: EM | Admit: 2017-01-07 | Discharge: 2017-01-07 | Disposition: A | Discharge status: Home / Self Care | Payer: Medicare Other | Attending: Emergency Medicine | Admitting: Emergency Medicine

## 2017-01-07 DIAGNOSIS — R791 Abnormal coagulation profile: Secondary | ICD-10-CM | POA: Insufficient documentation

## 2017-01-07 DIAGNOSIS — F0390 Unspecified dementia without behavioral disturbance: Secondary | ICD-10-CM | POA: Insufficient documentation

## 2017-01-07 DIAGNOSIS — Z79899 Other long term (current) drug therapy: Secondary | ICD-10-CM | POA: Diagnosis not present

## 2017-01-07 DIAGNOSIS — R4182 Altered mental status, unspecified: Secondary | ICD-10-CM | POA: Diagnosis present

## 2017-01-07 DIAGNOSIS — I10 Essential (primary) hypertension: Secondary | ICD-10-CM | POA: Diagnosis not present

## 2017-01-07 DIAGNOSIS — Z8673 Personal history of transient ischemic attack (TIA), and cerebral infarction without residual deficits: Secondary | ICD-10-CM | POA: Diagnosis not present

## 2017-01-07 DIAGNOSIS — Z7902 Long term (current) use of antithrombotics/antiplatelets: Secondary | ICD-10-CM | POA: Diagnosis not present

## 2017-01-07 DIAGNOSIS — Z859 Personal history of malignant neoplasm, unspecified: Secondary | ICD-10-CM | POA: Diagnosis not present

## 2017-01-07 DIAGNOSIS — R410 Disorientation, unspecified: Secondary | ICD-10-CM | POA: Diagnosis not present

## 2017-01-07 LAB — BASIC METABOLIC PANEL
ANION GAP: 8 (ref 5–15)
BUN: 33 mg/dL — AB (ref 6–20)
CHLORIDE: 109 mmol/L (ref 101–111)
CO2: 22 mmol/L (ref 22–32)
Calcium: 8.6 mg/dL — ABNORMAL LOW (ref 8.9–10.3)
Creatinine, Ser: 1.24 mg/dL — ABNORMAL HIGH (ref 0.44–1.00)
GFR calc Af Amer: 45 mL/min — ABNORMAL LOW (ref >60)
GFR, EST NON AFRICAN AMERICAN: 38 mL/min — AB (ref >60)
Glucose, Bld: 112 mg/dL — ABNORMAL HIGH (ref 65–99)
POTASSIUM: 4.8 mmol/L (ref 3.5–5.1)
Sodium: 139 mmol/L (ref 135–145)

## 2017-01-07 LAB — I-STAT CHEM 8, ED
BUN: 37 mg/dL — ABNORMAL HIGH (ref 6–20)
CHLORIDE: 108 mmol/L (ref 101–111)
Calcium, Ion: 1.08 mmol/L — ABNORMAL LOW (ref 1.15–1.40)
Creatinine, Ser: 1.3 mg/dL — ABNORMAL HIGH (ref 0.44–1.00)
GLUCOSE: 107 mg/dL — AB (ref 65–99)
HEMATOCRIT: 31 % — AB (ref 36.0–46.0)
Hemoglobin: 10.5 g/dL — ABNORMAL LOW (ref 12.0–15.0)
POTASSIUM: 4.8 mmol/L (ref 3.5–5.1)
Sodium: 141 mmol/L (ref 135–145)
TCO2: 24 mmol/L (ref 0–100)

## 2017-01-07 LAB — URINALYSIS, ROUTINE W REFLEX MICROSCOPIC
BILIRUBIN URINE: NEGATIVE
Glucose, UA: NEGATIVE mg/dL
HGB URINE DIPSTICK: NEGATIVE
KETONES UR: NEGATIVE mg/dL
Leukocytes, UA: NEGATIVE
NITRITE: NEGATIVE
PROTEIN: NEGATIVE mg/dL
SPECIFIC GRAVITY, URINE: 1.017 (ref 1.005–1.030)
pH: 5 (ref 5.0–8.0)

## 2017-01-07 LAB — I-STAT TROPONIN, ED: Troponin i, poc: 0.01 ng/mL (ref 0.00–0.08)

## 2017-01-07 LAB — PROTIME-INR
INR: 1.02
PROTHROMBIN TIME: 13.4 s (ref 11.4–15.2)

## 2017-01-07 LAB — CBG MONITORING, ED: Glucose-Capillary: 93 mg/dL (ref 65–99)

## 2017-01-07 LAB — APTT: aPTT: 28 seconds (ref 24–36)

## 2017-01-07 NOTE — ED Provider Notes (Signed)
[place] DEPT Provider Note  CSN: [id number] Arrival date & time: 01/07/17  2012  History   Chief Complaint Chief Complaint  Patient presents with  . Altered Mental Status   HPI Nancy Rowland is a 83 y.o. female.  The history is provided by the patient and medical records. No language interpreter was used.  Illness  This is a new problem. The current episode started 2 days ago. Episode frequency: Intermittent. Progression since onset: Waxing & waning. Associated symptoms include headaches. Pertinent negatives include no chest pain, no abdominal pain and no shortness of breath. Nothing aggravates the symptoms. Nothing relieves the symptoms.    Past Medical History:  Diagnosis Date  . Cancer ([place])   . Stroke [facility]) 08/2014   Patient Active Problem List   Diagnosis Date Noted  . Ischemic stroke ([place])   . History of stroke   . HLD (hyperlipidemia)   . Essential hypertension   . TIA (transient ischemic attack) 11/02/2015   Past Surgical History:  Procedure Laterality Date  . ABDOMINAL HYSTERECTOMY    . TEE WITHOUT CARDIOVERSION N/A 11/04/2015   Procedure: TRANSESOPHAGEAL ECHOCARDIOGRAM (TEE);  Surgeon: Skeet Latch, MD;  Location: [facility] ENDOSCOPY;  Service: Cardiovascular;  Laterality: N/A;   OB History    No data available     Home Medications    Prior to Admission medications   Medication Sig Start Date End Date Taking? Authorizing Provider  alendronate (FOSAMAX) 70 MG tablet Take 70 mg by mouth once a week. Take with a full glass of water on an empty stomach.   Yes Historical Provider, MD  ARIPiprazole (ABILIFY) 2 MG tablet Take 1 mg by mouth daily.   Yes Historical Provider, MD  atorvastatin (LIPITOR) 10 MG tablet Take 10 mg by mouth daily.   Yes Historical Provider, MD  busPIRone (BUSPAR) 10 MG tablet Take 20 mg by mouth 2 (two) times daily.   Yes Historical Provider, MD  Calcium Carbonate-Vitamin D3 (CALCIUM 600-D) 600-400 MG-UNIT TABS Take 1 tablet by mouth 2  (two) times daily.   Yes Historical Provider, MD  clopidogrel (PLAVIX) 75 MG tablet Take 1 tablet (75 mg total) by mouth daily. 11/04/15  Yes Tasrif Ahmed, MD  desvenlafaxine (PRISTIQ) 100 MG 24 hr tablet Take 100 mg by mouth daily.   Yes Historical Provider, MD  losartan (COZAAR) 25 MG tablet Take 25 mg by mouth daily.   Yes Historical Provider, MD  senna-docusate (SENOKOT-S) 8.6-50 MG tablet Take 1 tablet by mouth at bedtime. 11/04/15  Yes Tasrif Ahmed, MD  UNABLE TO FIND 1 each by Other route 3 (three) times daily. Med Name: House Shake   Yes Historical Provider, MD  atorvastatin (LIPITOR) 40 MG tablet Take 1 tablet (40 mg total) by mouth daily at 6 PM. Patient not taking: Reported on 01/07/2017 11/04/15   Dellia Nims, MD  valACYclovir (VALTREX) 1000 MG tablet Take 1 tablet (1,000 mg total) by mouth 3 (three) times daily. Patient not taking: Reported on 01/07/2017 12/05/15   Robyn Haber, MD   Family History History reviewed. No pertinent family history.  Social History Social History  Substance Use Topics  . Smoking status: Never Smoker  . Smokeless tobacco: Never Used  . Alcohol use Not on file   Allergies   Patient has no known allergies.  Review of Systems Review of Systems  Constitutional: Negative for chills and fever.  Respiratory: Negative for cough and shortness of breath.   Cardiovascular: Negative for chest pain.  Gastrointestinal: Negative for  abdominal pain.  Neurological: Positive for headaches.  All other systems reviewed and are negative.  Physical Exam Updated Vital Signs BP 114/56   Pulse 82   Temp 97.8 F (36.6 C) (Oral)   Resp 23   SpO2 100%   Physical Exam  Constitutional: She is oriented to person, place, and time. No distress.  Elderly Caucasian female  HENT:  Head: Normocephalic and atraumatic.  Eyes: EOM are normal. Pupils are equal, round, and reactive to light.  Neck: Normal range of motion. Neck supple.  Cardiovascular: Normal rate,  regular rhythm and normal heart sounds.   Pulmonary/Chest: Effort normal and breath sounds normal.  Abdominal: Soft. Bowel sounds are normal. She exhibits no distension. There is no tenderness.  Musculoskeletal: Normal range of motion.  Neurological: She is alert and oriented to person, place, and time.  CN II-XII intact (very hard of hearing), strength 5/5 throughout, sensation grossly normal, coordination normal, no pronator drift, speech fluent, gait normal  Skin: Skin is warm and dry. Capillary refill takes less than 2 seconds. She is not diaphoretic.  Nursing note and vitals reviewed.  ED Treatments / Results  Labs (all labs ordered are listed, but only abnormal results are displayed) Labs Reviewed  BASIC METABOLIC PANEL - Abnormal; Notable for the following:       Result Value   Glucose, Bld 112 (*)    BUN 33 (*)    Creatinine, Ser 1.24 (*)    Calcium 8.6 (*)    GFR calc non Af Amer 38 (*)    GFR calc Af Amer 45 (*)    All other components within normal limits  I-STAT CHEM 8, ED - Abnormal; Notable for the following:    BUN 37 (*)    Creatinine, Ser 1.30 (*)    Glucose, Bld 107 (*)    Calcium, Ion 1.08 (*)    Hemoglobin 10.5 (*)    HCT 31.0 (*)    All other components within normal limits  URINE CULTURE  APTT  PROTIME-INR  URINALYSIS, ROUTINE W REFLEX MICROSCOPIC  I-STAT TROPOININ, ED  CBG MONITORING, ED   EKG  EKG Interpretation  Date/Time:  Monday January 07 2017 20:17:52 EST Ventricular Rate:  80 PR Interval:    QRS Duration: 87 QT Interval:  395 QTC Calculation: 456 R Axis:   9 Text Interpretation:  Sinus rhythm Low voltage, extremity leads RSR' in V1 or V2, right VCD or RVH Confirmed by Alvino Chapel  MD, NATHAN [phone]) on 01/07/2017 10:12:13 PM      Radiology Ct Head Wo Contrast  Result Date: 01/07/2017 CLINICAL DATA:  Headache and altered mental status. Patient on Plavix and aspirin. EXAM: CT HEAD WITHOUT CONTRAST TECHNIQUE: Contiguous axial images were  obtained from the base of the skull through the vertex without intravenous contrast. COMPARISON:  Head CT and brain MRI 11/02/2015 FINDINGS: Brain: No evidence of acute infarction, hemorrhage, hydrocephalus, extra-axial collection or mass lesion/mass effect. Multifocal remote infarcts in the right cerebellum, right and left occipital lobes, right thalamus and right parietal lobe. Stable atrophy and chronic small vessel ischemia. Basal gangliar calcifications are unchanged. Vascular: Atherosclerosis of skullbase vasculature without hyperdense vessel or abnormal calcification. Skull: Normal. Negative for fracture or focal lesion. Sinuses/Orbits: Scattered opacification of ethmoid air cells. Remaining paranasal sinuses are clear. Mastoid air cells are well-aerated. The visualized orbits are unremarkable. Other: None. IMPRESSION: 1.  No acute intracranial abnormality.  No intracranial hemorrhage. 2. Stable atrophy and chronic small vessel ischemia. Stable multifocal remote infarcts.  Electronically Signed   By: Jeb Levering M.D.   On: 01/07/2017 21:07   Dg Chest Portable 1 View  Result Date: 01/07/2017 CLINICAL DATA:  AMS, HA, taking Plavis/ASA, asses for hemorrhage. Pt c/o headache and bilateral arm soreness. Hx of stroke. Pt is a nonsmoker. EXAM: PORTABLE CHEST 1 VIEW COMPARISON:  Chest x-ray dated 05/08/2015. FINDINGS: Heart size and mediastinal contours are stable. Atherosclerotic changes noted at the aortic arch. Lungs are clear. No pleural effusion or pneumothorax seen. Osseous and soft tissue structures about the chest are unremarkable. IMPRESSION: No active disease. No evidence of pneumonia, pulmonary edema or pleural effusion. Aortic atherosclerosis. Electronically Signed   By: Franki Cabot M.D.   On: 01/07/2017 20:43   Procedures Procedures (including critical care time)  Medications Ordered in ED Medications - No data to display  Initial Impression / Assessment and Plan / ED Course  I have  reviewed the triage vital signs and the nursing notes.  83 y.o. female with above stated PMHx, HPI, and physical. Confusion over past 2 days described by nursing staff has presenting different complaints but then forgetting about them. Patient also notes mild generalized headache for the past couple days.  Afebrile. Not tachycardic. Normotensive. UA showing no evidence of infection or severe dehydration or blood. Chest x-ray showing no acute cardiopulmonary disease specifically no evidence of pneumonia. CT head showing no acute intracranial abnormality. Labs unremarkable.  Laboratory and imaging results were personally reviewed by myself and used in the medical decision making of this patient's treatment and disposition.  Upon talking with daughter further, patient has intermittent confusion at baseline. This may be evidence of early dementia.  Pt discharged back to SNF in stable condition. Strict ED return precautions dicussed. Pt understands and agrees with the plan and has no further questions or concerns.   Pt care discussed with and followed by my attending, Dr. Davonna Belling  Mayer Camel, MD Pager [phone]  Final Clinical Impressions(s) / ED Diagnoses   Final diagnoses:  Confusion, intermittent, baseline  Early dementia   New Prescriptions New Prescriptions   No medications on file     Mayer Camel, MD 01/07/17 [place], MD 01/07/17 2326

## 2017-01-07 NOTE — ED Triage Notes (Signed)
Pt here from a nursing facility. Pt complains of headache. Facility sent over due to change in mental status. Only complaint is bilateral arm soreness. Alert and oriented to baseline.

## 2017-01-07 NOTE — ED Notes (Signed)
Patient Alert and oriented X4. Stable and ambulatory. Patient verbalized understanding of the discharge instructions.  Patient belongings were taken by the patient.  

## 2017-01-09 LAB — URINE CULTURE: CULTURE: NO GROWTH

## 2017-04-30 ENCOUNTER — Emergency Department (HOSPITAL_COMMUNITY)
Admission: EM | Admit: 2017-04-30 | Discharge: 2017-04-30 | Disposition: A | Discharge status: Home / Self Care | Payer: Medicare Other | Attending: Physician Assistant | Admitting: Physician Assistant

## 2017-04-30 ENCOUNTER — Emergency Department (HOSPITAL_COMMUNITY): Payer: Medicare Other

## 2017-04-30 DIAGNOSIS — S62102A Fracture of unspecified carpal bone, left wrist, initial encounter for closed fracture: Secondary | ICD-10-CM

## 2017-04-30 DIAGNOSIS — Z8673 Personal history of transient ischemic attack (TIA), and cerebral infarction without residual deficits: Secondary | ICD-10-CM | POA: Diagnosis not present

## 2017-04-30 DIAGNOSIS — W06XXXA Fall from bed, initial encounter: Secondary | ICD-10-CM | POA: Diagnosis not present

## 2017-04-30 DIAGNOSIS — S0081XA Abrasion of other part of head, initial encounter: Secondary | ICD-10-CM | POA: Insufficient documentation

## 2017-04-30 DIAGNOSIS — Y939 Activity, unspecified: Secondary | ICD-10-CM | POA: Diagnosis not present

## 2017-04-30 DIAGNOSIS — Y929 Unspecified place or not applicable: Secondary | ICD-10-CM | POA: Diagnosis not present

## 2017-04-30 DIAGNOSIS — S0990XA Unspecified injury of head, initial encounter: Secondary | ICD-10-CM | POA: Diagnosis not present

## 2017-04-30 DIAGNOSIS — S6992XA Unspecified injury of left wrist, hand and finger(s), initial encounter: Secondary | ICD-10-CM | POA: Diagnosis present

## 2017-04-30 DIAGNOSIS — Y999 Unspecified external cause status: Secondary | ICD-10-CM | POA: Insufficient documentation

## 2017-04-30 DIAGNOSIS — S6292XA Unspecified fracture of left wrist and hand, initial encounter for closed fracture: Secondary | ICD-10-CM | POA: Insufficient documentation

## 2017-04-30 DIAGNOSIS — I10 Essential (primary) hypertension: Secondary | ICD-10-CM | POA: Diagnosis not present

## 2017-04-30 MED ORDER — ACETAMINOPHEN 325 MG PO TABS
650.0000 mg | ORAL_TABLET | Freq: Once | ORAL | Status: AC
  Administered 2017-04-30: 650 mg via ORAL
  Filled 2017-04-30: qty 2

## 2017-04-30 MED ORDER — IBUPROFEN 200 MG PO TABS
400.0000 mg | ORAL_TABLET | Freq: Once | ORAL | Status: AC
  Administered 2017-04-30: 400 mg via ORAL
  Filled 2017-04-30: qty 2

## 2017-04-30 MED ORDER — OXYCODONE-ACETAMINOPHEN 5-325 MG PO TABS: 1.0000 | ORAL_TABLET | Freq: Four times a day (QID) | ORAL | 0 refills | Status: DC | PRN

## 2017-04-30 NOTE — ED Notes (Signed)
DISCHARGED WITH DAUGHTER BACK TO NURSING HOME

## 2017-04-30 NOTE — ED Notes (Signed)
Patient transported to X-ray 

## 2017-04-30 NOTE — ED Triage Notes (Addendum)
Pt from [facility] following a fall, pt states she lost her balance and fell. Pt has a 1.5 superficial laceration on the back of her head Pt denies LOC. Pt is not on blood thinners. Pt is tremulous at baseline. Pt has obvious deformity to left wrist, and it was splinted by EMS. Pt rates her pain 2/10. Pt's closest family is in [place].   Pt is hard of hearing, but can hear better in her Left ear  Pt's gold colored ring removed from left hand and placed in a specimen cup with patient label attached. Cup is sitting on bedside table.

## 2017-04-30 NOTE — Discharge Instructions (Signed)
Patient has a broken left wrist. Please keep the splint in place until follow-up with Dr. Erlinda Hong. Dennis Bast may use Percocet if patient has a lot of pain, however can make her dizzy and increase fall risks to be careful.

## 2017-04-30 NOTE — ED Notes (Signed)
Patient transported to CT 

## 2017-04-30 NOTE — ED Notes (Signed)
HERITAGE GREEN SPOKE WITH "[name]" ATTEMPTING TO "[name]" GIVEN DISCHARGE INSTRUCTIONS

## 2017-04-30 NOTE — ED Notes (Signed)
ICE PACK GIVEN

## 2017-04-30 NOTE — ED Provider Notes (Signed)
[facility] DEPT Provider Note   CSN: [id number] Arrival date & time: 04/30/17  1458     History   Chief Complaint Chief Complaint  Patient presents with  . Fall  . Wrist Injury    left  . Head Laceration    HPI Nancy Rowland is a 83 y.o. female.  HPI  Patient's age 83 year old female with history of stroke, dementia, presenting with fall. Patient fell earlier today. It was a  mechanical fall as she was starting to get into her bed. No LOC. Patient has pain in the left wrist with deformity noted by EMS. Pain the right knee. And small abrasion to the back of head. Patient not on blood thinners.   Past Medical History:  Diagnosis Date  . Cancer ([place])   . Stroke [facility]) 08/2014    Patient Active Problem List   Diagnosis Date Noted  . Ischemic stroke ([place])   . History of stroke   . HLD (hyperlipidemia)   . Essential hypertension   . TIA (transient ischemic attack) 11/02/2015    Past Surgical History:  Procedure Laterality Date  . ABDOMINAL HYSTERECTOMY    . TEE WITHOUT CARDIOVERSION N/A 11/04/2015   Procedure: TRANSESOPHAGEAL ECHOCARDIOGRAM (TEE);  Surgeon: Skeet Latch, MD;  Location: [facility] ENDOSCOPY;  Service: Cardiovascular;  Laterality: N/A;    OB History    No data available       Home Medications    Prior to Admission medications   Medication Sig Start Date End Date Taking? Authorizing Provider  alendronate (FOSAMAX) 70 MG tablet Take 70 mg by mouth once a week. Take with a full glass of water on an empty stomach.   Yes [provider]  ARIPiprazole (ABILIFY) 2 MG tablet Take 1 mg by mouth daily.   Yes [provider]  atorvastatin (LIPITOR) 10 MG tablet Take 10 mg by mouth daily.   Yes [provider]  busPIRone (BUSPAR) 10 MG tablet Take 20 mg by mouth 2 (two) times daily.   Yes [provider]  Calcium Carbonate-Vitamin D3 (CALCIUM 600-D) 600-400 MG-UNIT TABS Take 1 tablet by mouth 2 (two) times daily.   Yes  [provider]  clopidogrel (PLAVIX) 75 MG tablet Take 1 tablet (75 mg total) by mouth daily. 11/04/15  Yes Ahmed, Chesley Mires, MD  desvenlafaxine (PRISTIQ) 100 MG 24 hr tablet Take 100 mg by mouth daily.   Yes [provider]  losartan (COZAAR) 25 MG tablet Take 25 mg by mouth daily.   Yes [provider]  senna-docusate (SENOKOT-S) 8.6-50 MG tablet Take 1 tablet by mouth at bedtime. 11/04/15  Yes Ahmed, Chesley Mires, MD  UNABLE TO FIND 1 each by Other route 3 (three) times daily. Med Name: [facility]   Yes [provider]  atorvastatin (LIPITOR) 40 MG tablet Take 1 tablet (40 mg total) by mouth daily at 6 PM. Patient not taking: Reported on 01/07/2017 11/04/15   Dellia Nims, MD  valACYclovir (VALTREX) 1000 MG tablet Take 1 tablet (1,000 mg total) by mouth 3 (three) times daily. Patient not taking: Reported on 01/07/2017 12/05/15   Robyn Haber, MD    Family History No family history on file.  Social History Social History  Substance Use Topics  . Smoking status: Never Smoker  . Smokeless tobacco: Never Used  . Alcohol use Not on file     Allergies   Patient has no known allergies.   Review of Systems Review of Systems  Constitutional: Negative for activity change.  Respiratory: Negative for shortness of breath.   Cardiovascular: Negative for chest pain.  Gastrointestinal: Negative for abdominal pain.  Musculoskeletal: Positive for arthralgias.  Skin: Positive for wound.     Physical Exam Updated Vital Signs BP 95/73 (BP Location: Right Arm)   Pulse 70   Temp 98 F (36.7 C) (Oral)   Resp 18   SpO2 100%   Physical Exam  Constitutional: She is oriented to person, place, and time. She appears well-developed and well-nourished.  Frail elderly female.  HENT:  Head: Normocephalic and atraumatic.  Eyes: EOM are normal. Pupils are equal, round, and reactive to light. Right eye exhibits no discharge. Left eye exhibits no discharge.    Abrasion to back of head  Cardiovascular: Normal rate and regular rhythm.   Pulmonary/Chest: Effort normal and breath sounds normal. No respiratory distress.  Abdominal: Soft. She exhibits no distension.  Musculoskeletal:  Pain to right knee.  Good ROM bilatearl LE. Nomral.   Neurological: She is oriented to person, place, and time. No cranial nerve deficit.  Good pulses the left distal extremity sensation intact movement intact  Skin: Skin is warm and dry. She is not diaphoretic.  Psychiatric: She has a normal mood and affect.  Nursing note and vitals reviewed.    ED Treatments / Results  Labs (all labs ordered are listed, but only abnormal results are displayed) Labs Reviewed - No data to display  EKG  EKG Interpretation None       Radiology Dg Wrist Complete Left  Result Date: 04/30/2017 CLINICAL DATA:  Fall EXAM: LEFT WRIST - COMPLETE 3+ VIEW COMPARISON:  None. FINDINGS: Comminuted fracture distal radius with dorsal displacement and mild angulation. Nondisplaced fracture of the ulnar styloid. Moderate degenerative changes in the wrist joint with deformity of the navicular joint. IMPRESSION: Comminuted and dorsally displaced fracture distal radius. Nondisplaced fracture ulnar styloid. Electronically Signed   By: Franchot Gallo M.D.   On: 04/30/2017 15:49   Dg Knee 2 Views Right  Result Date: 04/30/2017 CLINICAL DATA:  Fall out of bed with knee pain, initial encounter EXAM: RIGHT KNEE - 2 VIEW COMPARISON:  None. FINDINGS: No acute fracture or dislocation is noted. Mild medial joint space narrowing is seen. No joint effusion is noted. IMPRESSION: No acute abnormality noted. Electronically Signed   By: Inez Catalina M.D.   On: 04/30/2017 16:22   Ct Head Wo Contrast  Result Date: 04/30/2017 CLINICAL DATA:  Mechanical fall today. History of stroke and dementia. Abrasion to the back of the head. Initial encounter. EXAM: CT HEAD WITHOUT CONTRAST CT CERVICAL SPINE WITHOUT CONTRAST  TECHNIQUE: Multidetector CT imaging of the head and cervical spine was performed following the standard protocol without intravenous contrast. Multiplanar CT image reconstructions of the cervical spine were also generated. COMPARISON:  Head CT 01/07/2017 and MRI 11/02/2015. FINDINGS: CT HEAD FINDINGS Brain: Chronic infarcts are again seen in the right cerebellum, bilateral occipital lobes, and right parietal lobe. No acute infarct, intracranial hemorrhage, mass, midline shift, or extra-axial fluid collection is seen. There is mild cerebral atrophy. Periventricular white matter hypodensities are nonspecific but compatible with mild chronic small vessel ischemic disease, unchanged. Vascular: Calcified atherosclerosis at the skullbase. No hyperdense vessel. Skull: No fracture or focal osseous lesion. Sinuses/Orbits: Partial bilateral ethmoid air cell opacification bilaterally. Visualized mastoid air cells are clear. Orbits are unremarkable. Other: Minimal posterior scalp soft tissue swelling in the midline. CT CERVICAL SPINE FINDINGS Alignment: Mild reversal of the normal cervical lordosis. Trace anterolisthesis of C7 on  T1 and C3 on C4, likely degenerative. Skull base and vertebrae: No acute fracture or destructive osseous process identified. Mild-to-moderate right lateral atlantoaxial arthropathy. Soft tissues and spinal canal: No prevertebral swelling. Disc levels: Diffuse cervical disc degeneration, advanced at C4-5 and C5-6. Advanced facet arthrosis on the right at C7-T1 and on the left at C3-4. Bilateral C2-3 facet ankylosis. Likely moderate spinal stenosis at C5-6 greater than C4-5. Moderate to severe bilateral foraminal stenosis at C5-6. Upper chest: Pleural-parenchymal scarring in the lung apices. Other: Retropharyngeal course of the distal common and proximal internal carotid arteries on the right. IMPRESSION: 1. No evidence of acute intracranial abnormality. 2. Mild posterior scalp swelling. 3. Chronic  cerebral and cerebellar infarcts as above. 4. Advanced cervical disc and facet degeneration without evidence of acute fracture. Electronically Signed   By: Logan Bores M.D.   On: 04/30/2017 16:38   Ct Cervical Spine Wo Contrast  Result Date: 04/30/2017 CLINICAL DATA:  Mechanical fall today. History of stroke and dementia. Abrasion to the back of the head. Initial encounter. EXAM: CT HEAD WITHOUT CONTRAST CT CERVICAL SPINE WITHOUT CONTRAST TECHNIQUE: Multidetector CT imaging of the head and cervical spine was performed following the standard protocol without intravenous contrast. Multiplanar CT image reconstructions of the cervical spine were also generated. COMPARISON:  Head CT 01/07/2017 and MRI 11/02/2015. FINDINGS: CT HEAD FINDINGS Brain: Chronic infarcts are again seen in the right cerebellum, bilateral occipital lobes, and right parietal lobe. No acute infarct, intracranial hemorrhage, mass, midline shift, or extra-axial fluid collection is seen. There is mild cerebral atrophy. Periventricular white matter hypodensities are nonspecific but compatible with mild chronic small vessel ischemic disease, unchanged. Vascular: Calcified atherosclerosis at the skullbase. No hyperdense vessel. Skull: No fracture or focal osseous lesion. Sinuses/Orbits: Partial bilateral ethmoid air cell opacification bilaterally. Visualized mastoid air cells are clear. Orbits are unremarkable. Other: Minimal posterior scalp soft tissue swelling in the midline. CT CERVICAL SPINE FINDINGS Alignment: Mild reversal of the normal cervical lordosis. Trace anterolisthesis of C7 on T1 and C3 on C4, likely degenerative. Skull base and vertebrae: No acute fracture or destructive osseous process identified. Mild-to-moderate right lateral atlantoaxial arthropathy. Soft tissues and spinal canal: No prevertebral swelling. Disc levels: Diffuse cervical disc degeneration, advanced at C4-5 and C5-6. Advanced facet arthrosis on the right at C7-T1 and  on the left at C3-4. Bilateral C2-3 facet ankylosis. Likely moderate spinal stenosis at C5-6 greater than C4-5. Moderate to severe bilateral foraminal stenosis at C5-6. Upper chest: Pleural-parenchymal scarring in the lung apices. Other: Retropharyngeal course of the distal common and proximal internal carotid arteries on the right. IMPRESSION: 1. No evidence of acute intracranial abnormality. 2. Mild posterior scalp swelling. 3. Chronic cerebral and cerebellar infarcts as above. 4. Advanced cervical disc and facet degeneration without evidence of acute fracture. Electronically Signed   By: Logan Bores M.D.   On: 04/30/2017 16:38    Procedures Procedures (including critical care time)  Medications Ordered in ED Medications  acetaminophen (TYLENOL) tablet 650 mg (not administered)  ibuprofen (ADVIL,MOTRIN) tablet 400 mg (not administered)     Initial Impression / Assessment and Plan / ED Course  I have reviewed the triage vital signs and the nursing notes.  Pertinent labs & imaging results that were available during my care of the patient were reviewed by me and considered in my medical decision making (see chart for details).     83 year old here with mechanical fall. We'll do x-ray of left wrist right knee CT head and neck.  5:06 PM Imaging shows fracture of the left distal radius. Splint placed. Discussed with Dr. Erlinda Hong of orthopedics who follow-up as an outpatient.  SPLINT APPLICATION Date/Time: 9:29 PM Authorized by: Gardiner Sleeper Consent: Verbal consent obtained. Risks and benefits: risks, benefits and alternatives were discussed Consent given by: patient Splint applied by: orthopedic technician Location details: L radisu Post-procedure: The splinted body part was neurovascularly unchanged following the procedure. Patient tolerance: Patient tolerated the procedure well with no immediate complications.     Final Clinical Impressions(s) / ED Diagnoses   Final  diagnoses:  None    New Prescriptions New Prescriptions   No medications on file     Macarthur Critchley, MD 04/30/17 1706

## 2017-04-30 NOTE — ED Notes (Signed)
Bed: WA11 Expected date:  Expected time:  Means of arrival:  Comments: EMS 

## 2017-04-30 NOTE — ED Notes (Signed)
ORTHO TECH PRESENT TO APPLY SPLINT

## 2017-05-03 ENCOUNTER — Encounter (INDEPENDENT_AMBULATORY_CARE_PROVIDER_SITE_OTHER): Payer: Self-pay | Admitting: Family

## 2017-05-03 ENCOUNTER — Ambulatory Visit (INDEPENDENT_AMBULATORY_CARE_PROVIDER_SITE_OTHER): Payer: Medicare Other

## 2017-05-03 ENCOUNTER — Ambulatory Visit (INDEPENDENT_AMBULATORY_CARE_PROVIDER_SITE_OTHER): Payer: Medicare Other | Admitting: Family

## 2017-05-03 VITALS — Ht 64.0 in | Wt 146.0 lb

## 2017-05-03 DIAGNOSIS — S52502A Unspecified fracture of the lower end of left radius, initial encounter for closed fracture: Secondary | ICD-10-CM | POA: Diagnosis not present

## 2017-05-03 DIAGNOSIS — M25532 Pain in left wrist: Secondary | ICD-10-CM | POA: Diagnosis not present

## 2017-05-03 NOTE — Progress Notes (Signed)
Office Visit Note   Patient: Nancy Rowland           Date of Birth: May 01, 1929           MRN: [id number] Visit Date: 05/03/2017              Requested by: No referring provider defined for this encounter. PCP: System, Pcp Not In  Chief Complaint  Patient presents with  . Left Wrist - Pain, Injury    Left wrist comminuted and dorsally displaced fracture distal radius, nondisplaced fracture ulnar styloid.       HPI: The patient is an 83 year old woman who presents take 1 of left wrist pain. She does have a history of dementia. Resides at [facility]. She sustained a fall and subsequently had radiographs on May 22. Radiograph showed a comminuted and dorsally displaced distal radius fracture. And as well as a nondisplaced fracture of her ulnar styloid. She was placed in a sling as well as a sling. She unfortunately removed her own sling as well as the splint that was placed in the emergency department. Today presents with her daughter she is pleasantly confused.  No complaints by the patient  Assessment & Plan: Visit Diagnoses:  1. Left wrist pain   2. Closed fracture of distal end of left radius, unspecified fracture morphology, initial encounter     Plan: We will place her in a short arm cast she'll follow-up in office in 2 weeks with repeat radiographs.  Follow-Up Instructions: Return in about 2 weeks (around 05/17/2017) for with xu.   Ortho Exam  Patient is alert, oriented, no adenopathy, well-dressed, normal affect, normal respiratory effort. There is moderate swelling as well as ecchymosis to the left wrist and forearm. Skin is intact. No blisters. She is able to wiggle her fingers. cap Refill is brisk.  Imaging: No results found.  Labs: Lab Results  Component Value Date   HGBA1C 6.2 (H) 11/03/2015   REPTSTATUS 01/09/2017 FINAL 01/07/2017   CULT NO GROWTH 01/07/2017    Orders:  Orders Placed This Encounter  Procedures  . XR Wrist 2 Views Left   No orders of  the defined types were placed in this encounter.    Procedures: No procedures performed  Clinical Data: No additional findings.  ROS:  All other systems negative, except as noted in the HPI. Review of Systems  Constitutional: Negative for chills and fever.  Musculoskeletal: Positive for arthralgias.  Skin: Positive for color change.    Objective: Vital Signs: Ht 5\' 4"  (1.626 m)   Wt 146 lb (66.2 kg)   BMI 25.06 kg/m   Specialty Comments:  No specialty comments available.  PMFS History: Patient Active Problem List   Diagnosis Date Noted  . Ischemic stroke ([place])   . History of stroke   . HLD (hyperlipidemia)   . Essential hypertension   . TIA (transient ischemic attack) 11/02/2015   Past Medical History:  Diagnosis Date  . Cancer ([place])   . Stroke [facility]) 08/2014    No family history on file.  Past Surgical History:  Procedure Laterality Date  . ABDOMINAL HYSTERECTOMY    . TEE WITHOUT CARDIOVERSION N/A 11/04/2015   Procedure: TRANSESOPHAGEAL ECHOCARDIOGRAM (TEE);  Surgeon: Skeet Latch, MD;  Location: [place];  Service: Cardiovascular;  Laterality: N/A;   Social History   Occupational History  . Not on file.   Social History Main Topics  . Smoking status: Never Smoker  . Smokeless tobacco: Never Used  . Alcohol  use Not on file  . Drug use: Unknown  . Sexual activity: Not on file

## 2017-05-13 ENCOUNTER — Ambulatory Visit (INDEPENDENT_AMBULATORY_CARE_PROVIDER_SITE_OTHER): Payer: Self-pay | Admitting: Orthopaedic Surgery

## 2017-05-16 ENCOUNTER — Ambulatory Visit (INDEPENDENT_AMBULATORY_CARE_PROVIDER_SITE_OTHER): Payer: Self-pay | Admitting: Orthopaedic Surgery

## 2017-05-20 ENCOUNTER — Ambulatory Visit (INDEPENDENT_AMBULATORY_CARE_PROVIDER_SITE_OTHER): Payer: Self-pay | Admitting: Orthopaedic Surgery

## 2017-05-27 ENCOUNTER — Ambulatory Visit (INDEPENDENT_AMBULATORY_CARE_PROVIDER_SITE_OTHER): Payer: Medicare Other | Admitting: Orthopaedic Surgery

## 2017-05-27 ENCOUNTER — Ambulatory Visit (INDEPENDENT_AMBULATORY_CARE_PROVIDER_SITE_OTHER): Payer: Medicare Other

## 2017-05-27 DIAGNOSIS — S52502A Unspecified fracture of the lower end of left radius, initial encounter for closed fracture: Secondary | ICD-10-CM | POA: Diagnosis not present

## 2017-05-27 NOTE — Progress Notes (Signed)
   Office Visit Note   Patient: Nancy Rowland           Date of Birth: [birth year]           MRN: [id number] Visit Date: 05/27/2017              Requested by: No referring provider defined for this encounter. PCP: System, Pcp Not In   Assessment & Plan: Visit Diagnoses:  1. Closed fracture of distal end of left radius, unspecified fracture morphology, initial encounter     Plan: Long-arm cast was converted to a short arm cast today. X-ray show stable alignment of the fracture. Follow-up in 3 weeks with repeat 2 view x-rays of the left wrist out of the cast. We will plan on treating this nonoperatively.  Follow-Up Instructions: Return in about 3 weeks (around 06/17/2017).   Orders:  Orders Placed This Encounter  Procedures  . XR Wrist 2 Views Left   No orders of the defined types were placed in this encounter.     Procedures: No procedures performed   Clinical Data: No additional findings.   Subjective: Chief Complaint  Patient presents with  . Left Wrist - Fracture, Follow-up    Nancy Rowland is 3 weeks status post left distal radius fracture. She's been in a long-arm cast. She follows up today. She is doing well and denies pain.    Review of Systems   Objective: Vital Signs: There were no vitals taken for this visit.  Physical Exam  Ortho Exam She has a well fitting cast. Fingers are more perfused. Specialty Comments:  No specialty comments available.  Imaging: No results found.   PMFS History: Patient Active Problem List   Diagnosis Date Noted  . Closed fracture of left distal radius 05/27/2017  . Ischemic stroke ([place])   . History of stroke   . HLD (hyperlipidemia)   . Essential hypertension   . TIA (transient ischemic attack) 11/02/2015   Past Medical History:  Diagnosis Date  . Cancer ([place])   . Stroke [facility]) 08/2014    No family history on file.  Past Surgical History:  Procedure Laterality Date  . ABDOMINAL HYSTERECTOMY    . TEE WITHOUT  CARDIOVERSION N/A 11/04/2015   Procedure: TRANSESOPHAGEAL ECHOCARDIOGRAM (TEE);  Surgeon: Skeet Latch, MD;  Location: [place];  Service: Cardiovascular;  Laterality: N/A;   Social History   Occupational History  . Not on file.   Social History Main Topics  . Smoking status: Never Smoker  . Smokeless tobacco: Never Used  . Alcohol use Not on file  . Drug use: Unknown  . Sexual activity: Not on file

## 2017-06-17 ENCOUNTER — Ambulatory Visit (INDEPENDENT_AMBULATORY_CARE_PROVIDER_SITE_OTHER): Payer: Medicare Other

## 2017-06-17 ENCOUNTER — Ambulatory Visit (INDEPENDENT_AMBULATORY_CARE_PROVIDER_SITE_OTHER): Payer: Medicare Other | Admitting: Orthopaedic Surgery

## 2017-06-17 DIAGNOSIS — S52502D Unspecified fracture of the lower end of left radius, subsequent encounter for closed fracture with routine healing: Secondary | ICD-10-CM | POA: Diagnosis not present

## 2017-06-17 NOTE — Progress Notes (Signed)
Nancy Rowland is 6 weeks status post left distal radius fracture. She is not complaining of any pain. She just has some anxiety. She's been doing well with a cast. X-ray show a healed distal radius fracture in acceptable alignment. She does not endorse any swelling or pain today. Range of motion is limited secondary to immobilization. At this point begin occupational therapy for joint mobilization and strengthening. Wrist brace was provided today. Follow-up with me as needed. Questions encouraged and answered.

## 2017-06-25 ENCOUNTER — Emergency Department (HOSPITAL_COMMUNITY): Payer: Medicare Other

## 2017-06-25 ENCOUNTER — Observation Stay (HOSPITAL_COMMUNITY)
Admission: EM | Admit: 2017-06-25 | Discharge: 2017-06-26 | Disposition: A | Discharge status: Skilled Nursing Facility | Payer: Medicare Other | Attending: Internal Medicine | Admitting: Internal Medicine

## 2017-06-25 ENCOUNTER — Encounter (HOSPITAL_COMMUNITY): Payer: Self-pay | Admitting: Emergency Medicine

## 2017-06-25 DIAGNOSIS — I739 Peripheral vascular disease, unspecified: Secondary | ICD-10-CM | POA: Diagnosis not present

## 2017-06-25 DIAGNOSIS — E785 Hyperlipidemia, unspecified: Secondary | ICD-10-CM | POA: Diagnosis not present

## 2017-06-25 DIAGNOSIS — Y939 Activity, unspecified: Secondary | ICD-10-CM | POA: Insufficient documentation

## 2017-06-25 DIAGNOSIS — F0280 Dementia in other diseases classified elsewhere without behavioral disturbance: Secondary | ICD-10-CM | POA: Diagnosis not present

## 2017-06-25 DIAGNOSIS — Z90710 Acquired absence of both cervix and uterus: Secondary | ICD-10-CM | POA: Insufficient documentation

## 2017-06-25 DIAGNOSIS — I10 Essential (primary) hypertension: Secondary | ICD-10-CM | POA: Diagnosis present

## 2017-06-25 DIAGNOSIS — Z79899 Other long term (current) drug therapy: Secondary | ICD-10-CM | POA: Insufficient documentation

## 2017-06-25 DIAGNOSIS — H9190 Unspecified hearing loss, unspecified ear: Secondary | ICD-10-CM | POA: Insufficient documentation

## 2017-06-25 DIAGNOSIS — S82045A Nondisplaced comminuted fracture of left patella, initial encounter for closed fracture: Secondary | ICD-10-CM | POA: Diagnosis not present

## 2017-06-25 DIAGNOSIS — Z8673 Personal history of transient ischemic attack (TIA), and cerebral infarction without residual deficits: Secondary | ICD-10-CM

## 2017-06-25 DIAGNOSIS — W19XXXA Unspecified fall, initial encounter: Secondary | ICD-10-CM | POA: Diagnosis present

## 2017-06-25 DIAGNOSIS — Z859 Personal history of malignant neoplasm, unspecified: Secondary | ICD-10-CM | POA: Diagnosis not present

## 2017-06-25 DIAGNOSIS — S52502A Unspecified fracture of the lower end of left radius, initial encounter for closed fracture: Secondary | ICD-10-CM | POA: Diagnosis present

## 2017-06-25 DIAGNOSIS — G309 Alzheimer's disease, unspecified: Secondary | ICD-10-CM | POA: Diagnosis not present

## 2017-06-25 DIAGNOSIS — F0390 Unspecified dementia without behavioral disturbance: Secondary | ICD-10-CM | POA: Diagnosis present

## 2017-06-25 DIAGNOSIS — S82032A Displaced transverse fracture of left patella, initial encounter for closed fracture: Secondary | ICD-10-CM | POA: Diagnosis not present

## 2017-06-25 DIAGNOSIS — Z7902 Long term (current) use of antithrombotics/antiplatelets: Secondary | ICD-10-CM | POA: Insufficient documentation

## 2017-06-25 DIAGNOSIS — R8281 Pyuria: Secondary | ICD-10-CM

## 2017-06-25 DIAGNOSIS — S52502D Unspecified fracture of the lower end of left radius, subsequent encounter for closed fracture with routine healing: Secondary | ICD-10-CM | POA: Diagnosis not present

## 2017-06-25 LAB — I-STAT CHEM 8, ED
BUN: 29 mg/dL — AB (ref 6–20)
CALCIUM ION: 1.08 mmol/L — AB (ref 1.15–1.40)
Chloride: 109 mmol/L (ref 101–111)
Creatinine, Ser: 1.2 mg/dL — ABNORMAL HIGH (ref 0.44–1.00)
GLUCOSE: 99 mg/dL (ref 65–99)
HCT: 35 % — ABNORMAL LOW (ref 36.0–46.0)
Hemoglobin: 11.9 g/dL — ABNORMAL LOW (ref 12.0–15.0)
Potassium: 4.4 mmol/L (ref 3.5–5.1)
SODIUM: 141 mmol/L (ref 135–145)
TCO2: 21 mmol/L (ref 0–100)

## 2017-06-25 LAB — COMPREHENSIVE METABOLIC PANEL
ALBUMIN: 4.2 g/dL (ref 3.5–5.0)
ALK PHOS: 74 U/L (ref 38–126)
ALT: 18 U/L (ref 14–54)
AST: 21 U/L (ref 15–41)
Anion gap: 9 (ref 5–15)
BUN: 31 mg/dL — AB (ref 6–20)
CHLORIDE: 110 mmol/L (ref 101–111)
CO2: 21 mmol/L — AB (ref 22–32)
Calcium: 8.7 mg/dL — ABNORMAL LOW (ref 8.9–10.3)
Creatinine, Ser: 1.29 mg/dL — ABNORMAL HIGH (ref 0.44–1.00)
GFR calc Af Amer: 43 mL/min — ABNORMAL LOW (ref >60)
GFR calc non Af Amer: 37 mL/min — ABNORMAL LOW (ref >60)
GLUCOSE: 102 mg/dL — AB (ref 65–99)
Potassium: 4.3 mmol/L (ref 3.5–5.1)
SODIUM: 140 mmol/L (ref 135–145)
TOTAL PROTEIN: 7.1 g/dL (ref 6.5–8.1)
Total Bilirubin: 0.5 mg/dL (ref 0.3–1.2)

## 2017-06-25 LAB — CBC WITH DIFFERENTIAL/PLATELET
BASOS ABS: 0 10*3/uL (ref 0.0–0.1)
Basophils Relative: 0 %
EOS PCT: 1 %
Eosinophils Absolute: 0.1 10*3/uL (ref 0.0–0.7)
HEMATOCRIT: 34.6 % — AB (ref 36.0–46.0)
HEMOGLOBIN: 11.9 g/dL — AB (ref 12.0–15.0)
LYMPHS PCT: 20 %
Lymphs Abs: 1.8 10*3/uL (ref 0.7–4.0)
MCH: 31 pg (ref 26.0–34.0)
MCHC: 34.4 g/dL (ref 30.0–36.0)
MCV: 90.1 fL (ref 78.0–100.0)
Monocytes Absolute: 1.1 10*3/uL — ABNORMAL HIGH (ref 0.1–1.0)
Monocytes Relative: 12 %
NEUTROS ABS: 6.1 10*3/uL (ref 1.7–7.7)
NEUTROS PCT: 67 %
PLATELETS: 189 10*3/uL (ref 150–400)
RBC: 3.84 MIL/uL — AB (ref 3.87–5.11)
RDW: 13.2 % (ref 11.5–15.5)
WBC: 9.1 10*3/uL (ref 4.0–10.5)

## 2017-06-25 LAB — URINALYSIS, ROUTINE W REFLEX MICROSCOPIC
Bilirubin Urine: NEGATIVE
Glucose, UA: NEGATIVE mg/dL
HGB URINE DIPSTICK: NEGATIVE
Ketones, ur: 5 mg/dL — AB
NITRITE: NEGATIVE
PROTEIN: 30 mg/dL — AB
SPECIFIC GRAVITY, URINE: 1.018 (ref 1.005–1.030)
pH: 5 (ref 5.0–8.0)

## 2017-06-25 MED ORDER — ATORVASTATIN CALCIUM 10 MG PO TABS
10.0000 mg | ORAL_TABLET | Freq: Every day | ORAL | Status: DC
  Administered 2017-06-26: 10:00:00 10 mg via ORAL
  Filled 2017-06-25 (×2): qty 1

## 2017-06-25 MED ORDER — SERTRALINE HCL 25 MG PO TABS
75.0000 mg | ORAL_TABLET | Freq: Every day | ORAL | Status: DC
  Administered 2017-06-26: 10:00:00 75 mg via ORAL
  Filled 2017-06-25: qty 1

## 2017-06-25 MED ORDER — CALCIUM CARBONATE-VITAMIN D3 600-400 MG-UNIT PO TABS: 1.0000 | ORAL_TABLET | Freq: Two times a day (BID) | ORAL | Status: DC

## 2017-06-25 MED ORDER — CLOPIDOGREL BISULFATE 75 MG PO TABS
75.0000 mg | ORAL_TABLET | Freq: Every day | ORAL | Status: DC
  Administered 2017-06-26: 75 mg via ORAL
  Filled 2017-06-25 (×2): qty 1

## 2017-06-25 MED ORDER — ACETAMINOPHEN 325 MG PO TABS
650.0000 mg | ORAL_TABLET | Freq: Once | ORAL | Status: AC
  Administered 2017-06-25: 650 mg via ORAL
  Filled 2017-06-25: qty 2

## 2017-06-25 MED ORDER — ENOXAPARIN SODIUM 30 MG/0.3ML ~~LOC~~ SOLN
30.0000 mg | SUBCUTANEOUS | Status: DC
  Administered 2017-06-25: 30 mg via SUBCUTANEOUS
  Filled 2017-06-25: qty 0.3

## 2017-06-25 MED ORDER — SENNOSIDES-DOCUSATE SODIUM 8.6-50 MG PO TABS
1.0000 | ORAL_TABLET | Freq: Every day | ORAL | Status: DC
  Filled 2017-06-25: qty 1

## 2017-06-25 MED ORDER — MELATONIN 3 MG PO TABS: 3.0000 mg | ORAL_TABLET | Freq: Every day | ORAL | Status: DC

## 2017-06-25 MED ORDER — ACETAMINOPHEN 325 MG PO TABS: 650.0000 mg | ORAL_TABLET | Freq: Four times a day (QID) | ORAL | Status: DC | PRN

## 2017-06-25 MED ORDER — ONDANSETRON HCL 4 MG/2ML IJ SOLN: 4.0000 mg | Freq: Four times a day (QID) | INTRAMUSCULAR | Status: DC | PRN

## 2017-06-25 MED ORDER — LOSARTAN POTASSIUM 25 MG PO TABS
12.5000 mg | ORAL_TABLET | Freq: Every day | ORAL | Status: DC
  Administered 2017-06-26: 12.5 mg via ORAL
  Filled 2017-06-25: qty 1

## 2017-06-25 MED ORDER — CALCIUM CARBONATE-VITAMIN D 500-200 MG-UNIT PO TABS
1.0000 | ORAL_TABLET | Freq: Two times a day (BID) | ORAL | Status: DC
  Administered 2017-06-26: 10:00:00 1 via ORAL
  Filled 2017-06-25 (×2): qty 1

## 2017-06-25 MED ORDER — ONDANSETRON HCL 4 MG PO TABS: 4.0000 mg | ORAL_TABLET | Freq: Four times a day (QID) | ORAL | Status: DC | PRN

## 2017-06-25 MED ORDER — ARIPIPRAZOLE 2 MG PO TABS
1.0000 mg | ORAL_TABLET | Freq: Every day | ORAL | Status: DC
  Administered 2017-06-26: 1 mg via ORAL
  Filled 2017-06-25: qty 1

## 2017-06-25 MED ORDER — BUSPIRONE HCL 5 MG PO TABS
15.0000 mg | ORAL_TABLET | Freq: Three times a day (TID) | ORAL | Status: DC
  Filled 2017-06-25: qty 3

## 2017-06-25 MED ORDER — OXYCODONE-ACETAMINOPHEN 5-325 MG PO TABS
0.5000 | ORAL_TABLET | Freq: Four times a day (QID) | ORAL | Status: DC | PRN
  Filled 2017-06-25: qty 1

## 2017-06-25 NOTE — Progress Notes (Signed)
PHARMACIST - PHYSICIAN ORDER COMMUNICATION  CONCERNING: P&T Medication Policy on Herbal Medications  DESCRIPTION:  This patient's order for:  Melatonin  has been noted.  This product(s) is classified as an "herbal" or natural product. Due to a lack of definitive safety studies or FDA approval, nonstandard manufacturing practices, plus the potential risk of unknown drug-drug interactions while on inpatient medications, the Pharmacy and Therapeutics Committee does not permit the use of "herbal" or natural products of this type within [facility].   ACTION TAKEN: The pharmacy department is unable to verify this order at this time and your patient has been informed of this safety policy. Please reevaluate patient's clinical condition at discharge and address if the herbal or natural product(s) should be resumed at that time.  Dia Sitter, PharmD, BCPS 06/25/2017 8:32 PM

## 2017-06-25 NOTE — H&P (Addendum)
History and Physical    Nancy Rowland IRJ:[id number] DOB: 1929/03/10 DOA: 06/25/2017  Referring MD/NP/PA: EDP  PCP:  Patient coming from: Assisted Living  Chief Complaint: fall/Knee pain  HPI: Nancy Rowland is a 83 y.o. female with medical history significant of dementia, hypertension, stroke, hearing loss was brought to the emergency room after a mechanical fall and complains of severe knee pain. Patient is very hard of hearing and an unreliable historian, from the information that could be obtained no reported loss of consciousness. To me patient denies any complaints other than left knee pain which is currently immobilized in a brace. In the emergency room she was noted to have left knee transverse patella fracture and no other acute findings on multiple imaging studies. Attempts made at ambulation in the ER were unsuccessful and hence [facility] as well as social work was consulted for further management. Labs pending at this time  Review of Systems: As per HPI otherwise 14 point review of systems negative.   Past Medical History:  Diagnosis Date  . Cancer ([place])   . Stroke [facility]) 08/2014    Past Surgical History:  Procedure Laterality Date  . ABDOMINAL HYSTERECTOMY    . TEE WITHOUT CARDIOVERSION N/A 11/04/2015   Procedure: TRANSESOPHAGEAL ECHOCARDIOGRAM (TEE);  Surgeon: Skeet Latch, MD;  Location: [facility] ENDOSCOPY;  Service: Cardiovascular;  Laterality: N/A;     reports that she has never smoked. She has never used smokeless tobacco. Her alcohol and drug histories are not on file.  No Known Allergies  No family history on file.   Prior to Admission medications   Medication Sig Start Date End Date Taking? Authorizing Provider  alendronate (FOSAMAX) 70 MG tablet Take 70 mg by mouth once a week. Take with a full glass of water on an empty stomach.   Yes [provider]  ARIPiprazole (ABILIFY) 2 MG tablet Take 1 mg by mouth daily.   Yes [provider]    atorvastatin (LIPITOR) 10 MG tablet Take 10 mg by mouth daily.   Yes [provider]  busPIRone (BUSPAR) 15 MG tablet Take 15 mg by mouth 3 (three) times daily.    Yes [provider]  Calcium Carbonate-Vitamin D3 (CALCIUM 600-D) 600-400 MG-UNIT TABS Take 1 tablet by mouth 2 (two) times daily.   Yes [provider]  clopidogrel (PLAVIX) 75 MG tablet Take 1 tablet (75 mg total) by mouth daily. 11/04/15  Yes Ahmed, Chesley Mires, MD  losartan (COZAAR) 25 MG tablet Take 12.5 mg by mouth daily.    Yes [provider]  Melatonin 3 MG TABS Take 3 mg by mouth at bedtime.   Yes [provider]  oxyCODONE-acetaminophen (PERCOCET/ROXICET) 5-325 MG tablet Take 0.5 tablets by mouth every 6 (six) hours as needed for severe pain.   Yes [provider]  senna-docusate (SENOKOT-S) 8.6-50 MG tablet Take 1 tablet by mouth at bedtime. 11/04/15  Yes Ahmed, Chesley Mires, MD  sertraline (ZOLOFT) 50 MG tablet Take 75 mg by mouth daily.   Yes [provider]  oxyCODONE-acetaminophen (PERCOCET/ROXICET) 5-325 MG tablet Take 1 tablet by mouth every 6 (six) hours as needed for severe pain. Patient not taking: Reported on 06/17/2017 04/30/17   Mackuen, Courteney Lyn, MD  valACYclovir (VALTREX) 1000 MG tablet Take 1 tablet (1,000 mg total) by mouth 3 (three) times daily. Patient not taking: Reported on 06/17/2017 12/05/15   Robyn Haber, MD    Physical Exam: Vitals:   06/25/17 1300 06/25/17 1504 06/25/17 1530 06/25/17 1713  BP: 117/85 140/78 139/74 (!) 152/69  Pulse: 68 77 78 83  Resp: 14 16 15 18   Temp:      TempSrc:      SpO2: 100% 100% 100% 98%      Vitals:   06/25/17 1300 06/25/17 1504 06/25/17 1530 06/25/17 1713  BP: 117/85 140/78 139/74 (!) 152/69  Pulse: 68 77 78 83  Resp: 14 16 15 18   Temp:      TempSrc:      SpO2: 100% 100% 100% 98%   Elderly, obese female, laying in stretcher, very hard of hearing ENMT: Mucous membranes are moist.  Neck supple no  JVD CVS S1-S2 regular rate rhythm, mildly tachycardic no murmurs noted Lungs: Clear anteriorly but decreased at the bases Abdomen soft obese nontender with positive bowel sounds Extremities: Left knee immobilized in a brace trace ankle swelling on the left, she also has a brace on her left wrist Neuro: Was able to move all extremities, no localizing signs at this time Psychiatric: Anxious, but mostly appropriate  Labs on Admission: I have personally reviewed following labs and imaging studies  CBC: No results for input(s): WBC, NEUTROABS, HGB, HCT, MCV, PLT in the last 168 hours. Basic Metabolic Panel: No results for input(s): NA, K, CL, CO2, GLUCOSE, BUN, CREATININE, CALCIUM, MG, PHOS in the last 168 hours. GFR: CrCl cannot be calculated (Patient's most recent lab result is older than the maximum 21 days allowed.). Liver Function Tests: No results for input(s): AST, ALT, ALKPHOS, BILITOT, PROT, ALBUMIN in the last 168 hours. No results for input(s): LIPASE, AMYLASE in the last 168 hours. No results for input(s): AMMONIA in the last 168 hours. Coagulation Profile: No results for input(s): INR, PROTIME in the last 168 hours. Cardiac Enzymes: No results for input(s): CKTOTAL, CKMB, CKMBINDEX, TROPONINI in the last 168 hours. BNP (last 3 results) No results for input(s): PROBNP in the last 8760 hours. HbA1C: No results for input(s): HGBA1C in the last 72 hours. CBG: No results for input(s): GLUCAP in the last 168 hours. Lipid Profile: No results for input(s): CHOL, HDL, LDLCALC, TRIG, CHOLHDL, LDLDIRECT in the last 72 hours. Thyroid Function Tests: No results for input(s): TSH, T4TOTAL, FREET4, T3FREE, THYROIDAB in the last 72 hours. Anemia Panel: No results for input(s): VITAMINB12, FOLATE, FERRITIN, TIBC, IRON, RETICCTPCT in the last 72 hours. Urine analysis:    Component Value Date/Time   COLORURINE YELLOW 01/07/2017 2221   APPEARANCEUR CLEAR 01/07/2017 2221   LABSPEC 1.017  01/07/2017 2221   PHURINE 5.0 01/07/2017 2221   GLUCOSEU NEGATIVE 01/07/2017 2221   HGBUR NEGATIVE 01/07/2017 [place] 01/07/2017 [place] 01/07/2017 2221   PROTEINUR NEGATIVE 01/07/2017 2221   UROBILINOGEN 0.2 07/08/2007 1727   NITRITE NEGATIVE 01/07/2017 2221   LEUKOCYTESUR NEGATIVE 01/07/2017 2221   Sepsis Labs: @LABRCNTIP (procalcitonin:4,lacticidven:4) )No results found for this or any previous visit (from the past 240 hour(s)).   Radiological Exams on Admission: Dg Pelvis 1-2 Views  Result Date: 06/25/2017 CLINICAL DATA:  Fall, pain EXAM: PELVIS - 1-2 VIEW COMPARISON:  None. FINDINGS: No fracture or dislocation is seen. Mild degenerative changes of the left hip. Visualized bony pelvis appears intact. Degenerative changes of the lower lumbar spine. IMPRESSION: No fracture or dislocation is seen. Mild degenerative changes the left hip and lower lumbar spine. Electronically Signed   By: Julian Hy M.D.   On: 06/25/2017 13:51   Ct Head Wo Contrast  Result Date: 06/25/2017 CLINICAL DATA:  Pain  following fall.  History of dementia. EXAM: CT HEAD WITHOUT CONTRAST TECHNIQUE: Contiguous axial images were obtained from the base of the skull through the vertex without intravenous contrast. COMPARISON:  January 31, 2017 FINDINGS: Brain: There is mild diffuse atrophy. There is no intracranial mass, hemorrhage, extra-axial fluid collection, or midline shift. There is decreased attenuation in the medial and lateral aspects of the inferior right cerebellar hemisphere, stable. There is decreased attenuation in the medial occipital lobe regions bilaterally, more severe on the left than on the right, stable. There is decreased attenuation in the mid superior right parietal lobe. These stable areas of decreased attenuation are consistent with prior infarcts. Elsewhere there is patchy small vessel disease in the centra semiovale bilaterally. There is small vessel  disease throughout the anterior limb of the right internal capsule. No acute infarct is demonstrable on this study. Mild basal ganglia calcification is stable and felt to be physiologic in this age group. Vascular: No evident hyperdense vessel. There is calcification in each carotid siphon region as well as in the distal right vertebral artery. Skull: Bony calvarium appears intact. Sinuses/Orbits: There is mucosal thickening in several ethmoid air cells bilaterally with opacification in several posterior ethmoid air cells bilaterally. Other visualized paranasal sinuses are clear. Orbits appear symmetric bilaterally. Other: Visualized mastoid air cells are clear. IMPRESSION: Atrophy with prior infarcts in the posterior right cerebellum, each posterior occipital lobe, in superior mid right parietal lobe. There is patchy periventricular small vessel disease as well as small vessel disease in the anterior limb of the right internal capsule. No intracranial mass, hemorrhage, or extra-axial fluid collection. No acute infarct. Multiple foci of arterial vascular calcification noted. Foci of paranasal sinus disease in the ethmoid air cells noted. Electronically Signed   By: Lowella Grip III M.D.   On: 06/25/2017 13:42   Dg Shoulder Left  Result Date: 06/25/2017 CLINICAL DATA:  Fall EXAM: LEFT SHOULDER - 2+ VIEW COMPARISON:  02/05/2006 FINDINGS: Advanced degenerative change in the shoulder with progression. Advanced degenerative change in the [facility] joint. Calcified loose body overlying the joint. Negative for fracture. IMPRESSION: Advanced degenerative change in the shoulder and AC joint. Negative for fracture. Electronically Signed   By: Franchot Gallo M.D.   On: 06/25/2017 14:55   Dg Knee Complete 4 Views Left  Result Date: 06/25/2017 CLINICAL DATA:  Fall, left knee pain/swelling EXAM: LEFT KNEE - COMPLETE 4+ VIEW COMPARISON:  None. FINDINGS: Transverse patellar fracture, mildly comminuted. Mild prepatellar soft  tissue swelling. Suprapatellar knee joint effusion. IMPRESSION: Transverse patellar fracture, mildly comminuted. Electronically Signed   By: Julian Hy M.D.   On: 06/25/2017 13:52    EKG: pending  Assessment/Plan:   Fall/Patellar fracture -suspect mechanical fall, denies any LoC and none reported per staff at facility -EDP d/w [employer], recommended Brace and FU in office in 2 weeks -PT OT consult,  -Social work consult -Would likely benefit from short-term rehabilitation but have discussed with daughter about backup options in case this is not possible -labs pending at this time  History of CVA -CT head with atrophy and sequelae from prior strokes -Continue Plavix and statin  Essential hypertension -Stable continue ARB  Dementia and hearing loss -Chronic, stable issues  Recent left distal radius fracture -Wearing a wrist brace and recently seen by Dr.Xu with [place] [facility]  DVT prophylaxis: lovenox Code Status: DO NOT RESUSCITATE Family Communication: Discussed with daughter at bedside Disposition [place] be determined Consults called: d/w ortho per EDP  Admission status: observation  Domenic Polite MD [facility] Pager [phone]  If 7PM-7AM, please contact night-coverage [email or web address] Password [facility]  06/25/2017, 5:47 PM

## 2017-06-25 NOTE — ED Notes (Signed)
Daughter at bedside.

## 2017-06-25 NOTE — ED Notes (Signed)
Per patient request, patient's daughter called and updated on patient's status.

## 2017-06-25 NOTE — ED Provider Notes (Signed)
[facility] DEPT Provider Note   CSN: [id number] Arrival date & time: 06/25/17  1235     History   Chief Complaint Chief Complaint  Patient presents with  . Fall    HPI Nancy Rowland is a 83 y.o. female.  HPI Pt comes in after a fall. Pt has hx of dementia, TIA and resides at an independent living center, [facility]. Pt had an unwitnessed fall, and she complains of L sided pain to her legs, shoulder. Pt is not on blood thinners.  We spoke with Alfredo Bach. Pt is at assisted living and would not qualify if she cant bear weight. They dont have a SNF.  Past Medical History:  Diagnosis Date  . Cancer ([place])   . Stroke [facility]) 08/2014    Patient Active Problem List   Diagnosis Date Noted  . Dementia 06/25/2017  . Fall 06/25/2017  . Closed fracture of left distal radius 05/27/2017  . Ischemic stroke ([place])   . History of stroke   . HLD (hyperlipidemia)   . Essential hypertension   . TIA (transient ischemic attack) 11/02/2015    Past Surgical History:  Procedure Laterality Date  . ABDOMINAL HYSTERECTOMY    . TEE WITHOUT CARDIOVERSION N/A 11/04/2015   Procedure: TRANSESOPHAGEAL ECHOCARDIOGRAM (TEE);  Surgeon: Skeet Latch, MD;  Location: [facility] ENDOSCOPY;  Service: Cardiovascular;  Laterality: N/A;    OB History    No data available       Home Medications    Prior to Admission medications   Medication Sig Start Date End Date Taking? Authorizing Provider  alendronate (FOSAMAX) 70 MG tablet Take 70 mg by mouth once a week. Take with a full glass of water on an empty stomach.   Yes [provider]  ARIPiprazole (ABILIFY) 2 MG tablet Take 1 mg by mouth daily.   Yes [provider]  atorvastatin (LIPITOR) 10 MG tablet Take 10 mg by mouth daily.   Yes [provider]  busPIRone (BUSPAR) 15 MG tablet Take 15 mg by mouth 3 (three) times daily.    Yes [provider]  Calcium Carbonate-Vitamin D3 (CALCIUM 600-D) 600-400  MG-UNIT TABS Take 1 tablet by mouth 2 (two) times daily.   Yes [provider]  clopidogrel (PLAVIX) 75 MG tablet Take 1 tablet (75 mg total) by mouth daily. 11/04/15  Yes Ahmed, Chesley Mires, MD  losartan (COZAAR) 25 MG tablet Take 12.5 mg by mouth daily.    Yes [provider]  Melatonin 3 MG TABS Take 3 mg by mouth at bedtime.   Yes [provider]  oxyCODONE-acetaminophen (PERCOCET/ROXICET) 5-325 MG tablet Take 0.5 tablets by mouth every 6 (six) hours as needed for severe pain.   Yes [provider]  senna-docusate (SENOKOT-S) 8.6-50 MG tablet Take 1 tablet by mouth at bedtime. 11/04/15  Yes Ahmed, Chesley Mires, MD  sertraline (ZOLOFT) 50 MG tablet Take 75 mg by mouth daily.   Yes [provider]  oxyCODONE-acetaminophen (PERCOCET/ROXICET) 5-325 MG tablet Take 1 tablet by mouth every 6 (six) hours as needed for severe pain. Patient not taking: Reported on 06/17/2017 04/30/17   Mackuen, Courteney Lyn, MD  valACYclovir (VALTREX) 1000 MG tablet Take 1 tablet (1,000 mg total) by mouth 3 (three) times daily. Patient not taking: Reported on 06/17/2017 12/05/15   Robyn Haber, MD    Family History No family history on file.  Social History Social History  Substance Use Topics  . Smoking status: Never Smoker  . Smokeless tobacco: Never Used  .  Alcohol use Not on file     Allergies   Patient has no known allergies.   Review of Systems Review of Systems  Constitutional: Positive for activity change.  Musculoskeletal: Positive for gait problem.  Skin: Negative for wound.  Hematological: Does not bruise/bleed easily.     Physical Exam Updated Vital Signs BP 128/62 (BP Location: Right Arm)   Pulse 79   Temp 98 F (36.7 C) (Oral)   Resp 18   SpO2 97%   Physical Exam  Constitutional: She is oriented to person, place, and time. She appears well-developed.  HENT:  Head: Normocephalic and atraumatic.  Eyes: EOM are normal.  Neck: Normal range of  motion. Neck supple.  No midline c-spine tenderness, pt able to turn head to 45 degrees bilaterally without any pain and able to flex neck to the chest and extend without any pain or neurologic symptoms.   Cardiovascular: Normal rate.   Pulmonary/Chest: Effort normal.  Abdominal: Bowel sounds are normal.  Musculoskeletal: She exhibits edema and tenderness.  L knee tenderness, L shoulder tenderness  Neurological: She is alert and oriented to person, place, and time. No cranial nerve deficit.  Skin: Skin is warm and dry.  Nursing note and vitals reviewed.    ED Treatments / Results  Labs (all labs ordered are listed, but only abnormal results are displayed) Labs Reviewed  CBC WITH DIFFERENTIAL/PLATELET - Abnormal; Notable for the following:       Result Value   RBC 3.84 (*)    Hemoglobin 11.9 (*)    HCT 34.6 (*)    Monocytes Absolute 1.1 (*)    All other components within normal limits  URINALYSIS, ROUTINE W REFLEX MICROSCOPIC  COMPREHENSIVE METABOLIC PANEL  I-STAT CHEM 8, ED    EKG  EKG Interpretation None       Radiology Dg Pelvis 1-2 Views  Result Date: 06/25/2017 CLINICAL DATA:  Fall, pain EXAM: PELVIS - 1-2 VIEW COMPARISON:  None. FINDINGS: No fracture or dislocation is seen. Mild degenerative changes of the left hip. Visualized bony pelvis appears intact. Degenerative changes of the lower lumbar spine. IMPRESSION: No fracture or dislocation is seen. Mild degenerative changes the left hip and lower lumbar spine. Electronically Signed   By: Julian Hy M.D.   On: 06/25/2017 13:51   Ct Head Wo Contrast  Result Date: 06/25/2017 CLINICAL DATA:  Pain following fall.  History of dementia. EXAM: CT HEAD WITHOUT CONTRAST TECHNIQUE: Contiguous axial images were obtained from the base of the skull through the vertex without intravenous contrast. COMPARISON:  January 31, 2017 FINDINGS: Brain: There is mild diffuse atrophy. There is no intracranial mass, hemorrhage,  extra-axial fluid collection, or midline shift. There is decreased attenuation in the medial and lateral aspects of the inferior right cerebellar hemisphere, stable. There is decreased attenuation in the medial occipital lobe regions bilaterally, more severe on the left than on the right, stable. There is decreased attenuation in the mid superior right parietal lobe. These stable areas of decreased attenuation are consistent with prior infarcts. Elsewhere there is patchy small vessel disease in the centra semiovale bilaterally. There is small vessel disease throughout the anterior limb of the right internal capsule. No acute infarct is demonstrable on this study. Mild basal ganglia calcification is stable and felt to be physiologic in this age group. Vascular: No evident hyperdense vessel. There is calcification in each carotid siphon region as well as in the distal right vertebral artery. Skull: Bony calvarium appears intact. Sinuses/Orbits: There  is mucosal thickening in several ethmoid air cells bilaterally with opacification in several posterior ethmoid air cells bilaterally. Other visualized paranasal sinuses are clear. Orbits appear symmetric bilaterally. Other: Visualized mastoid air cells are clear. IMPRESSION: Atrophy with prior infarcts in the posterior right cerebellum, each posterior occipital lobe, in superior mid right parietal lobe. There is patchy periventricular small vessel disease as well as small vessel disease in the anterior limb of the right internal capsule. No intracranial mass, hemorrhage, or extra-axial fluid collection. No acute infarct. Multiple foci of arterial vascular calcification noted. Foci of paranasal sinus disease in the ethmoid air cells noted. Electronically Signed   By: Lowella Grip III M.D.   On: 06/25/2017 13:42   Dg Shoulder Left  Result Date: 06/25/2017 CLINICAL DATA:  Fall EXAM: LEFT SHOULDER - 2+ VIEW COMPARISON:  02/05/2006 FINDINGS: Advanced degenerative  change in the shoulder with progression. Advanced degenerative change in the [facility] joint. Calcified loose body overlying the joint. Negative for fracture. IMPRESSION: Advanced degenerative change in the shoulder and AC joint. Negative for fracture. Electronically Signed   By: Franchot Gallo M.D.   On: 06/25/2017 14:55   Dg Knee Complete 4 Views Left  Result Date: 06/25/2017 CLINICAL DATA:  Fall, left knee pain/swelling EXAM: LEFT KNEE - COMPLETE 4+ VIEW COMPARISON:  None. FINDINGS: Transverse patellar fracture, mildly comminuted. Mild prepatellar soft tissue swelling. Suprapatellar knee joint effusion. IMPRESSION: Transverse patellar fracture, mildly comminuted. Electronically Signed   By: Julian Hy M.D.   On: 06/25/2017 13:52    Procedures Procedures (including critical care time)  Medications Ordered in ED Medications  acetaminophen (TYLENOL) tablet 650 mg (650 mg Oral Given 06/25/17 1313)  acetaminophen (TYLENOL) tablet 650 mg (650 mg Oral Given 06/25/17 1503)     Initial Impression / Assessment and Plan / ED Course  I have reviewed the triage vital signs and the nursing notes.  Pertinent labs & imaging results that were available during my care of the patient were reviewed by me and considered in my medical decision making (see chart for details).     DDx includes: - Mechanical falls - ICH - Fractures - Contusions - Soft tissue injury  Pt has patellar fracture. Spoke with Dr. Percell Miller, Ortho. He recommends f/u in 2 weeks. He requests that pt get Bledsoe knee immobilizer, locked in extension and allow for ambulation as tolerated. Pt unable to ambulate - so we will admit to medicine. SW consulted for placement.  If pt cant walk she will need higher level of care than the existing facility.  Final Clinical Impressions(s) / ED Diagnoses   Final diagnoses:  Closed nondisplaced comminuted fracture of left patella, initial encounter    New Prescriptions New Prescriptions    No medications on file     Varney Biles, MD 06/25/17 1819

## 2017-06-25 NOTE — ED Triage Notes (Signed)
Per EMS, patient from [facility], staff reports unwitnessed fall this morning. Patient reports left knee pain and reports hitting her head. Hx dementia. A&O to baseline per staff. Plavix on facility MAR.

## 2017-06-25 NOTE — ED Notes (Signed)
Patient tearful c/o left shoulder pain. MD made aware.

## 2017-06-25 NOTE — ED Notes (Signed)
XR at bedside

## 2017-06-25 NOTE — ED Notes (Signed)
Attempted to call report, RN in pt room and will call back.

## 2017-06-25 NOTE — ED Notes (Signed)
Bed: WA13 Expected date:  Expected time:  Means of arrival:  Comments: EMS-fall 

## 2017-06-25 NOTE — ED Notes (Signed)
Hospitalist at bedside 

## 2017-06-25 NOTE — ED Notes (Signed)
Patient transported to X-ray 

## 2017-06-25 NOTE — ED Notes (Signed)
Patient unable to bear weight. Patient tearful at attempt to ambulate.

## 2017-06-25 NOTE — ED Notes (Signed)
Report called to [facility], [place] called to transport patient.

## 2017-06-26 DIAGNOSIS — G309 Alzheimer's disease, unspecified: Secondary | ICD-10-CM | POA: Diagnosis not present

## 2017-06-26 DIAGNOSIS — S52502D Unspecified fracture of the lower end of left radius, subsequent encounter for closed fracture with routine healing: Secondary | ICD-10-CM

## 2017-06-26 DIAGNOSIS — N390 Urinary tract infection, site not specified: Secondary | ICD-10-CM | POA: Diagnosis not present

## 2017-06-26 DIAGNOSIS — I10 Essential (primary) hypertension: Secondary | ICD-10-CM | POA: Diagnosis not present

## 2017-06-26 DIAGNOSIS — F0280 Dementia in other diseases classified elsewhere without behavioral disturbance: Secondary | ICD-10-CM | POA: Diagnosis not present

## 2017-06-26 DIAGNOSIS — S82045A Nondisplaced comminuted fracture of left patella, initial encounter for closed fracture: Secondary | ICD-10-CM | POA: Diagnosis not present

## 2017-06-26 DIAGNOSIS — W19XXXA Unspecified fall, initial encounter: Secondary | ICD-10-CM | POA: Diagnosis not present

## 2017-06-26 DIAGNOSIS — Z8673 Personal history of transient ischemic attack (TIA), and cerebral infarction without residual deficits: Secondary | ICD-10-CM

## 2017-06-26 DIAGNOSIS — R8281 Pyuria: Secondary | ICD-10-CM

## 2017-06-26 LAB — CBC
HCT: 36.3 % (ref 36.0–46.0)
Hemoglobin: 12.2 g/dL (ref 12.0–15.0)
MCH: 29.9 pg (ref 26.0–34.0)
MCHC: 33.6 g/dL (ref 30.0–36.0)
MCV: 89 fL (ref 78.0–100.0)
PLATELETS: 204 10*3/uL (ref 150–400)
RBC: 4.08 MIL/uL (ref 3.87–5.11)
RDW: 13.1 % (ref 11.5–15.5)
WBC: 7.1 10*3/uL (ref 4.0–10.5)

## 2017-06-26 LAB — BASIC METABOLIC PANEL
Anion gap: 10 (ref 5–15)
BUN: 26 mg/dL — ABNORMAL HIGH (ref 6–20)
CO2: 22 mmol/L (ref 22–32)
Calcium: 9.3 mg/dL (ref 8.9–10.3)
Chloride: 110 mmol/L (ref 101–111)
Creatinine, Ser: 1.3 mg/dL — ABNORMAL HIGH (ref 0.44–1.00)
GFR calc Af Amer: 42 mL/min — ABNORMAL LOW (ref >60)
GFR calc non Af Amer: 36 mL/min — ABNORMAL LOW (ref >60)
Glucose, Bld: 112 mg/dL — ABNORMAL HIGH (ref 65–99)
Potassium: 4.6 mmol/L (ref 3.5–5.1)
Sodium: 142 mmol/L (ref 135–145)

## 2017-06-26 LAB — MRSA PCR SCREENING: MRSA by PCR: NEGATIVE

## 2017-06-26 MED ORDER — OXYCODONE-ACETAMINOPHEN 5-325 MG PO TABS: 0.5000 | ORAL_TABLET | Freq: Four times a day (QID) | ORAL | 0 refills | Status: DC | PRN

## 2017-06-26 MED ORDER — CALCIUM CARBONATE-VITAMIN D 500-200 MG-UNIT PO TABS: 1.0000 | ORAL_TABLET | Freq: Two times a day (BID) | ORAL | 0 refills | Status: AC

## 2017-06-26 NOTE — NC FL2 (Signed)
[place] LEVEL OF CARE SCREENING TOOL     IDENTIFICATION  Patient Name: Nancy Rowland Birthdate: 10-Sep-1929 Sex: female Admission Date (Current Location): 06/25/2017  [facility] and [place] Number:  Herbalist and Address:  [facility],  [place], [place]      Provider Number: [id number]  Attending Physician Name and Address:  Kerney Elbe, DO  Relative Name and Phone Number:       Current Level of Care: Hospital Recommended Level of Care: [place] Prior Approval Number:    Date Approved/Denied:   PASRR Number:    Discharge Plan:  (ALF)    Current Diagnoses: Patient Active Problem List   Diagnosis Date Noted  . Pyuria 06/26/2017  . Dementia 06/25/2017  . Fall 06/25/2017  . Closed fracture of left distal radius 05/27/2017  . Ischemic stroke ([place])   . History of stroke   . HLD (hyperlipidemia)   . Essential hypertension   . TIA (transient ischemic attack) 11/02/2015    Orientation RESPIRATION BLADDER Height & Weight     Self  Normal Incontinent Weight:   Height:     BEHAVIORAL SYMPTOMS/MOOD NEUROLOGICAL BOWEL NUTRITION STATUS  Other (Comment) (no behaviors)   Continent Diet (Regular)  AMBULATORY STATUS COMMUNICATION OF NEEDS Skin   Extensive Assist Verbally Normal                       Personal Care Assistance Level of Assistance  Bathing, Feeding, Dressing Bathing Assistance: Maximum assistance Feeding assistance: Limited assistance Dressing Assistance: Maximum assistance     Functional Limitations Info  Sight, Hearing, Speech Sight Info: Adequate Hearing Info: Impaired Speech Info: Adequate    SPECIAL CARE FACTORS FREQUENCY  PT (By licensed PT), OT (By licensed OT)     PT Frequency: 3x wk OT Frequency: 3x wk            Contractures Contractures Info: Not present    Additional Factors Info  Code Status Code Status Info: DNR             Current  Medications (06/26/2017):  This is the current hospital active medication list Current Facility-Administered Medications  Medication Dose Route Frequency Provider Last Rate Last Dose  . acetaminophen (TYLENOL) tablet 650 mg  650 mg Oral Q6H PRN Domenic Polite, MD      . ARIPiprazole (ABILIFY) tablet 1 mg  1 mg Oral Daily Domenic Polite, MD   1 mg at 06/26/17 0947  . atorvastatin (LIPITOR) tablet 10 mg  10 mg Oral Daily Domenic Polite, MD   10 mg at 06/26/17 0946  . busPIRone (BUSPAR) tablet 15 mg  15 mg Oral TID Domenic Polite, MD      . calcium-vitamin D (OSCAL WITH D) 500-200 MG-UNIT per tablet 1 tablet  1 tablet Oral BID Domenic Polite, MD   1 tablet at 06/26/17 0947  . clopidogrel (PLAVIX) tablet 75 mg  75 mg Oral Daily Domenic Polite, MD   75 mg at 06/26/17 0946  . enoxaparin (LOVENOX) injection 30 mg  30 mg Subcutaneous Q24H Domenic Polite, MD   30 mg at 06/25/17 2145  . losartan (COZAAR) tablet 12.5 mg  12.5 mg Oral Daily Domenic Polite, MD   12.5 mg at 06/26/17 0946  . ondansetron (ZOFRAN) tablet 4 mg  4 mg Oral Q6H PRN Domenic Polite, MD       Or  . ondansetron [facility]) injection 4 mg  4  mg Intravenous Q6H PRN Domenic Polite, MD      . oxyCODONE-acetaminophen (PERCOCET/ROXICET) 5-325 MG per tablet 0.5 tablet  0.5 tablet Oral Q6H PRN Domenic Polite, MD      . senna-docusate (Senokot-S) tablet 1 tablet  1 tablet Oral QHS Domenic Polite, MD      . sertraline (ZOLOFT) tablet 75 mg  75 mg Oral Daily Domenic Polite, MD   75 mg at 06/26/17 8768     Discharge Medications: Please see discharge summary for a list of discharge medications. Medication List             STOP taking these medications            valACYclovir 1000 MG tablet Commonly known as:  VALTREX                       TAKE these medications            alendronate 70 MG tablet Commonly known as:  FOSAMAX Take 70 mg by mouth once a week. Take with a full glass of water on an empty stomach.     ARIPiprazole 2 MG tablet Commonly known as:  ABILIFY Take 1 mg by mouth daily.    atorvastatin 10 MG tablet Commonly known as:  LIPITOR Take 10 mg by mouth daily.    busPIRone 15 MG tablet Commonly known as:  BUSPAR Take 15 mg by mouth 3 (three) times daily.    CALCIUM 600-D 600-400 MG-UNIT Tabs Generic drug:  Calcium Carbonate-Vitamin D3 Take 1 tablet by mouth 2 (two) times daily.    calcium-vitamin D 500-200 MG-UNIT tablet Commonly known as:  OSCAL WITH D Take 1 tablet by mouth 2 (two) times daily.    clopidogrel 75 MG tablet Commonly known as:  PLAVIX Take 1 tablet (75 mg total) by mouth daily.    losartan 25 MG tablet Commonly known as:  COZAAR Take 12.5 mg by mouth daily.    Melatonin 3 MG Tabs Take 3 mg by mouth at bedtime.    oxyCODONE-acetaminophen 5-325 MG tablet Commonly known as:  PERCOCET/ROXICET Take 0.5 tablets by mouth every 6 (six) hours as needed for severe pain. What changed:  Another medication with the same name was removed. Continue taking this medication, and follow the directions you see here.    senna-docusate 8.6-50 MG tablet Commonly known as:  Senokot-S Take 1 tablet by mouth at bedtime.    sertraline 50 MG tablet Commonly known as:  ZOLOFT Take 75 mg by mouth daily.                              Relevant Imaging Results:  Relevant Lab Results:   Additional Information SS # [id number]. Pt will need PT / OT / hospital bed / w/c / bedside commode at facility.  Verdie Barrows, Randall An, LCSW

## 2017-06-26 NOTE — Progress Notes (Signed)
    Durable Medical Equipment        Start     Ordered   06/26/17 1415  DME 3-in-1  Once     06/26/17 1418   06/26/17 1414  For home use only DME lightweight manual wheelchair with seat cushion  (Wheelchairs)  Once    Comments:  Patient suffers from Gait instability which impairs their ability to perform daily activities like Ambulating in the home.  A Rolling walker will not resolve  issue with performing activities of daily living as she has a knee fracture. A wheelchair will allow patient to safely perform daily activities. Patient is not able to propel themselves in the home using a standard weight wheelchair due to weakness and dementia. Patient can self propel in the lightweight wheelchair.  Accessories: elevating leg rests (ELRs), wheel locks, extensions and anti-tippers.   06/26/17 1418   06/26/17 [place] bed    Question Answer Comment  The above medical condition requires: Patient requires the ability to reposition frequently   Bed type Semi-electric   Support Surface: Low Air loss Mattress      06/26/17 1418    [phone]

## 2017-06-26 NOTE — Evaluation (Signed)
Occupational Therapy Evaluation Patient Details Name: Nancy Rowland MRN: [id number] DOB: 1929/07/23 Today's Date: 06/26/2017    History of Present Illness Pt s/p fall with L patella fx - no surgical intervention planned - Bledsoe in place.  Pt with hx of CVA and dementia   Clinical Impression   Pt admitted with fall. Pt currently with functional limitations due to the deficits listed below (see OT Problem List).  Pt will benefit from skilled OT to increase their safety and independence with ADL and functional mobility for ADL to facilitate discharge to venue listed below.      Follow Up Recommendations  SNF;Other (comment) (or if ALF can provide increased care)    Equipment Recommendations  None recommended by OT    Recommendations for Other Services       Precautions / Restrictions Precautions Precautions: Fall Required Braces or Orthoses: Knee Immobilizer - Left Knee Immobilizer - Left: On at all times Restrictions Weight Bearing Restrictions: No      Mobility Bed Mobility Overal bed mobility: Needs Assistance Bed Mobility: Supine to Sit;Sit to Supine     Supine to sit: Mod assist;+2 for physical assistance;+2 for safety/equipment Sit to supine: Mod assist;+2 for physical assistance;+2 for safety/equipment   General bed mobility comments: Multimodal cues required to gain pt participation  Transfers                 General transfer comment: Pt sat on EOB only.  Unwilling to attempt standing.  "I just dont want to be involved in that"    Balance Overall balance assessment: Needs assistance Sitting-balance support: Bilateral upper extremity supported;Feet supported Sitting balance-Leahy Scale: Fair                                     ADL either performed or assessed with clinical judgement   ADL Overall ADL's : Needs assistance/impaired Eating/Feeding: Maximal assistance;Sitting   Grooming: Wash/dry face;Brushing hair;Sitting;Maximal  assistance                                 General ADL Comments: Pt very confused upon OT eval.    Pt will need significant A upon DC. Will likely need SNF rather than ALF     Vision Patient Visual Report: No change from baseline       Perception     Praxis      Pertinent Vitals/Pain Pain Assessment: No/denies pain     Hand Dominance     Extremity/Trunk Assessment Upper Extremity Assessment Upper Extremity Assessment:  (L wrist brace from old fracture)   Lower Extremity Assessment Lower Extremity Assessment: Generalized weakness   Cervical / Trunk Assessment Cervical / Trunk Assessment: Kyphotic   Communication Communication Communication: HOH   Cognition Arousal/Alertness: Awake/alert Behavior During Therapy: Anxious Overall Cognitive Status: History of cognitive impairments - at baseline                                                Home Living Family/patient expects to be discharged to:: Assisted living  Prior Functioning/Environment Level of Independence: Needs assistance        Comments: Pt from ALF, pt unable to provide reliable hx 2* dementia        OT Problem List: Decreased strength;Impaired balance (sitting and/or standing);Decreased safety awareness;Decreased activity tolerance      OT Treatment/Interventions: Self-care/ADL training;DME and/or AE instruction;Therapeutic activities    OT Goals(Current goals can be found in the care plan section) Acute Rehab OT Goals Patient Stated Goal: No goals stated OT Goal Formulation: Patient unable to participate in goal setting Time For Goal Achievement: 07/03/17  OT Frequency: Min 2X/week           Co-evaluation PT/OT/SLP Co-Evaluation/Treatment: Yes Reason for Co-Treatment: To address functional/ADL transfers PT goals addressed during session: Mobility/safety with mobility OT goals addressed during session:  ADL's and self-care      AM-PAC PT "6 Clicks" Daily Activity     Outcome Measure Help from another person eating meals?: A Lot Help from another person taking care of personal grooming?: A Lot Help from another person toileting, which includes using toliet, bedpan, or urinal?: Total Help from another person bathing (including washing, rinsing, drying)?: Total Help from another person to put on and taking off regular upper body clothing?: Total Help from another person to put on and taking off regular lower body clothing?: Total 6 Click Score: 8   End of Session Nurse Communication: Mobility status  Activity Tolerance: Treatment limited secondary to agitation Patient left: in bed;with call bell/phone within reach  OT Visit Diagnosis: Unsteadiness on feet (R26.81)                Time: 1150-1206 OT Time Calculation (min): 16 min Charges:  OT General Charges $OT Visit: 1 Procedure OT Evaluation $OT Eval Moderate Complexity: 1 Procedure G-Codes: OT G-codes **NOT FOR INPATIENT CLASS** Functional Assessment Tool Used: Clinical judgement Functional Limitation: Self care Self Care Current Status (K8206): At least 80 percent but less than 100 percent impaired, limited or restricted Self Care Goal Status (O1561): At least 80 percent but less than 100 percent impaired, limited or restricted   Kari Baars, [place]  Nancy Rowland 06/26/2017, 1:26 PM

## 2017-06-26 NOTE — Progress Notes (Signed)
CSW consulted to assist with d/c planning. Pt is from [facility]. CSW met with pt at bedside to offer support. Pt is alert and oriented to person. No family at bedside. PT eval is pending. VM left for pt's daughter to contact CSW for assistance with dc planning.   Werner Lean LCSW [phone]

## 2017-06-26 NOTE — Progress Notes (Signed)
    Durable Medical Equipment        Start     Ordered   06/26/17 1510  For home use only DME Hospital bed  Once    Question Answer Comment  Bed type Semi-electric   Support Surface: Gel Overlay      06/26/17 1510   06/26/17 1415  DME 3-in-1  Once     06/26/17 1418   06/26/17 1414  For home use only DME lightweight manual wheelchair with seat cushion  (Wheelchairs)  Once    Comments:  Patient suffers from Gait instability which impairs their ability to perform daily activities like Ambulating in the home.  A Rolling walker will not resolve  issue with performing activities of daily living as she has a knee fracture. A wheelchair will allow patient to safely perform daily activities. Patient is not able to propel themselves in the home using a standard weight wheelchair due to weakness and dementia. Patient can self propel in the lightweight wheelchair.  Accessories: elevating leg rests (ELRs), wheel locks, extensions and anti-tippers.   06/26/17 1418   06/26/17 [place] [facility] Hospital bed    Question Answer Comment  The above medical condition requires: Patient requires the ability to reposition frequently   Bed type Semi-electric   Support Surface: Low Air loss Mattress      06/26/17 1418

## 2017-06-26 NOTE — Progress Notes (Signed)
CSW spoke with pt's daughter's and Ms. Davis at [facility] to assist with d/c planning. Clinicals reviewed with Ms.Davis. Family is declining SNF at this time and requesting pt return to [facility]. Ms. Rosana Hoes reports that pt's care can be managed at ALF. Ms. Rosana Hoes has requested Hospital bed. W/C, and bedside commode be delivered to pt's room at ALF ( 118 ). CSW will continue to follow to assist with d/c planning back to ALF today.  Werner Lean LCSW [phone]

## 2017-06-26 NOTE — Discharge Summary (Signed)
Physician Discharge Summary  Nancy Rowland CHY:[id number] DOB: 03/20/1929 DOA: 06/25/2017  PCP: System, Pcp Not In  Admit date: 06/25/2017 Discharge date: 06/26/2017  Admitted From: ALF Disposition:  ALF with Home Health  Recommendations for Outpatient Follow-up:  1. Follow up with PCP in 1-2 weeks 2. Follow up with [facility] in 1-2 weeks  3. Please obtain CMP/CBC, Mag, Phos in one week 4. Please Follow up with Urine Cx Results   Home Health: Yes Equipment/Devices: Hospital Bed, Wheelchair, 3-in-1 Commode  Discharge Condition: Stable CODE STATUS: DO NOT RESUSCITATE Diet recommendation: Heart Healthy Diet  Brief/Interim Summary: Nancy Rowland is a 83 y.o. female with medical history significant of dementia, hypertension, stroke, hearing loss was brought to the emergency room after a mechanical fall and complains of severe knee pain.Patient is very hard of hearing and an unreliable historian, from the information that could be obtained no reported loss of consciousness. The patient denied any complaints other than left knee pain which is currently immobilized in a brace.In the emergency room she was noted to have left knee transverse patella fracture and no other acute findings on multiple imaging studies. Attempts made at ambulation in the ER were unsuccessful and hence TRH as well as social work was consulted for further management.  PT/OT reccommended SNF. Patient's Urine showed pyuria and urine cx was pending. She was observed and Social Work discussed with the [place] and she will be D/C'd back today with a hospital bed, Wheelchair, and 3-In-1 Bedside Commode. She will need to follow up with Orthopedics in 2 weeks for evaluation.   Discharge Diagnoses:  Active Problems:   History of stroke   Essential hypertension   Closed fracture of left distal radius   Dementia   Fall   Pyuria   Fall/Patellar fracture -Suspected mechanical fall, denies any LoC and none reported  per staff at facility -EDP d/w [employer], recommended Brace and FU in office in 2 weeks -PT OT consult recommending SNF but patient will be going back to Memory Care Unit at ALF with increased Assistance -Social work Scientific laboratory technician -Will write for [employer], [employer], and 3-In-1 bedside Commode -Continue PT/OT at [employer] and follow up with [facility]  History of CVA -CT head with atrophy and sequelae from prior strokes -Continue Plavix and statin at D/C  Essential Hypertension -Stable continue ARB at D/C  Dementia and hearing loss -Chronic, stable issues -C/w Buspar TID and Abilify 1 mg po Daily in addition to Sertaline  Recent Left Distal Radius Fracture -Wearing a wrist brace and recently seen by Dr.Xu with [employer] -Follow up with Dr. Erlinda Hong as an outpatient  Pyuria -U/A with Rare Bacteria, Large Leukocytes and TNTC WBC; Will not currently treat  -Afebrile and No Leukocytosis; Has baseline Dementia -Follow up Urine Cx Results and treat if Urine Cx Grows organisms or if patient is symptomatic  -Continue to Monitor in the outpatient setting.   Discharge Instructions  Discharge Instructions    Call MD for:  difficulty breathing, headache or visual disturbances    Complete by:  As directed    Call MD for:  extreme fatigue    Complete by:  As directed    Call MD for:  persistant dizziness or light-headedness    Complete by:  As directed    Call MD for:  persistant nausea and vomiting    Complete by:  As directed    Call MD for:  persistant nausea and vomiting  Complete by:  As directed    Call MD for:  redness, tenderness, or signs of infection (pain, swelling, redness, odor or green/yellow discharge around incision site)    Complete by:  As directed    Call MD for:  severe uncontrolled pain    Complete by:  As directed    Call MD for:  severe uncontrolled pain    Complete by:  As directed    Call MD for:  temperature >100.4    Complete by:   As directed    Call MD for:  temperature >100.4    Complete by:  As directed    DME Hospital bed    Complete by:  As directed    The above medical condition requires:  Patient requires the ability to reposition frequently   Bed type:  Semi-electric   Support Surface:  Low Air loss Mattress   Diet - low sodium heart healthy    Complete by:  As directed    Discharge instructions    Complete by:  As directed    Follow up Care at [facility]. Will need continued PT/OT. Follow up with PCP and Orthopedics as an outpatient.   Increase activity slowly    Complete by:  As directed      Allergies as of 06/26/2017   No Known Allergies     Medication List    STOP taking these medications   valACYclovir 1000 MG tablet Commonly known as:  VALTREX     TAKE these medications   alendronate 70 MG tablet Commonly known as:  FOSAMAX Take 70 mg by mouth once a week. Take with a full glass of water on an empty stomach.   ARIPiprazole 2 MG tablet Commonly known as:  ABILIFY Take 1 mg by mouth daily.   atorvastatin 10 MG tablet Commonly known as:  LIPITOR Take 10 mg by mouth daily.   busPIRone 15 MG tablet Commonly known as:  BUSPAR Take 15 mg by mouth 3 (three) times daily.   CALCIUM 600-D 600-400 MG-UNIT Tabs Generic drug:  Calcium Carbonate-Vitamin D3 Take 1 tablet by mouth 2 (two) times daily.   calcium-vitamin D 500-200 MG-UNIT tablet Commonly known as:  OSCAL WITH D Take 1 tablet by mouth 2 (two) times daily.   clopidogrel 75 MG tablet Commonly known as:  PLAVIX Take 1 tablet (75 mg total) by mouth daily.   losartan 25 MG tablet Commonly known as:  COZAAR Take 12.5 mg by mouth daily.   Melatonin 3 MG Tabs Take 3 mg by mouth at bedtime.   oxyCODONE-acetaminophen 5-325 MG tablet Commonly known as:  PERCOCET/ROXICET Take 0.5 tablets by mouth every 6 (six) hours as needed for severe pain. What changed:  Another medication with the same name was removed. Continue  taking this medication, and follow the directions you see here.   senna-docusate 8.6-50 MG tablet Commonly known as:  Senokot-S Take 1 tablet by mouth at bedtime.   sertraline 50 MG tablet Commonly known as:  ZOLOFT Take 75 mg by mouth daily.            Durable Medical Equipment        Start     Ordered   06/26/17 1415  DME 3-in-1  Once     06/26/17 1418   06/26/17 1414  For home use only DME lightweight manual wheelchair with seat cushion  (Wheelchairs)  Once    Comments:  Patient suffers from Gait instability which impairs their ability  to perform daily activities like Ambulating in the home.  A Rolling walker will not resolve  issue with performing activities of daily living as she has a knee fracture. A wheelchair will allow patient to safely perform daily activities. Patient is not able to propel themselves in the home using a standard weight wheelchair due to weakness and dementia. Patient can self propel in the lightweight wheelchair.  Accessories: elevating leg rests (ELRs), wheel locks, extensions and anti-tippers.   06/26/17 1418   06/26/17 [facility] bed    Question Answer Comment  The above medical condition requires: Patient requires the ability to reposition frequently   Bed type Semi-electric   Support Surface: Low Air loss Mattress      06/26/17 1418      No Known Allergies  Consultations:  PT/OT  Social Work/Care Management  Orthopedics via EDP  Procedures/Studies: Dg Pelvis 1-2 Views  Result Date: 06/25/2017 CLINICAL DATA:  Fall, pain EXAM: PELVIS - 1-2 VIEW COMPARISON:  None. FINDINGS: No fracture or dislocation is seen. Mild degenerative changes of the left hip. Visualized bony pelvis appears intact. Degenerative changes of the lower lumbar spine. IMPRESSION: No fracture or dislocation is seen. Mild degenerative changes the left hip and lower lumbar spine. Electronically Signed   By: Julian Hy M.D.   On: 06/25/2017 13:51   Ct  Head Wo Contrast  Result Date: 06/25/2017 CLINICAL DATA:  Pain following fall.  History of dementia. EXAM: CT HEAD WITHOUT CONTRAST TECHNIQUE: Contiguous axial images were obtained from the base of the skull through the vertex without intravenous contrast. COMPARISON:  January 31, 2017 FINDINGS: Brain: There is mild diffuse atrophy. There is no intracranial mass, hemorrhage, extra-axial fluid collection, or midline shift. There is decreased attenuation in the medial and lateral aspects of the inferior right cerebellar hemisphere, stable. There is decreased attenuation in the medial occipital lobe regions bilaterally, more severe on the left than on the right, stable. There is decreased attenuation in the mid superior right parietal lobe. These stable areas of decreased attenuation are consistent with prior infarcts. Elsewhere there is patchy small vessel disease in the centra semiovale bilaterally. There is small vessel disease throughout the anterior limb of the right internal capsule. No acute infarct is demonstrable on this study. Mild basal ganglia calcification is stable and felt to be physiologic in this age group. Vascular: No evident hyperdense vessel. There is calcification in each carotid siphon region as well as in the distal right vertebral artery. Skull: Bony calvarium appears intact. Sinuses/Orbits: There is mucosal thickening in several ethmoid air cells bilaterally with opacification in several posterior ethmoid air cells bilaterally. Other visualized paranasal sinuses are clear. Orbits appear symmetric bilaterally. Other: Visualized mastoid air cells are clear. IMPRESSION: Atrophy with prior infarcts in the posterior right cerebellum, each posterior occipital lobe, in superior mid right parietal lobe. There is patchy periventricular small vessel disease as well as small vessel disease in the anterior limb of the right internal capsule. No intracranial mass, hemorrhage, or extra-axial fluid  collection. No acute infarct. Multiple foci of arterial vascular calcification noted. Foci of paranasal sinus disease in the ethmoid air cells noted. Electronically Signed   By: Lowella Grip III M.D.   On: 06/25/2017 13:42   Dg Shoulder Left  Result Date: 06/25/2017 CLINICAL DATA:  Fall EXAM: LEFT SHOULDER - 2+ VIEW COMPARISON:  02/05/2006 FINDINGS: Advanced degenerative change in the shoulder with progression. Advanced degenerative change in the [facility] joint. Calcified loose body overlying  the joint. Negative for fracture. IMPRESSION: Advanced degenerative change in the shoulder and AC joint. Negative for fracture. Electronically Signed   By: Franchot Gallo M.D.   On: 06/25/2017 14:55   Dg Knee Complete 4 Views Left  Result Date: 06/25/2017 CLINICAL DATA:  Fall, left knee pain/swelling EXAM: LEFT KNEE - COMPLETE 4+ VIEW COMPARISON:  None. FINDINGS: Transverse patellar fracture, mildly comminuted. Mild prepatellar soft tissue swelling. Suprapatellar knee joint effusion. IMPRESSION: Transverse patellar fracture, mildly comminuted. Electronically Signed   By: Julian Hy M.D.   On: 06/25/2017 13:52   Xr Wrist 2 Views Left  Result Date: 06/17/2017 Healed distal radius fracture in acceptable alignment   Subjective: Seen and examined at bedside and had no complaints except left knee pain. No nausea or vomiting. No CP or SOB. Only alert to self. No other complaints or concerns.   Discharge Exam: Vitals:   06/25/17 2025 06/26/17 0511  BP: (!) 155/65 (!) 157/71  Pulse: 73 90  Resp:  20  Temp: 98.6 F (37 C) 99.1 F (37.3 C)   Vitals:   06/25/17 1830 06/25/17 1931 06/25/17 2025 06/26/17 0511  BP: (!) 147/64 136/66 (!) 155/65 (!) 157/71  Pulse: 72 82 73 90  Resp: (!) 23 13  20   Temp:  98.1 F (36.7 C) 98.6 F (37 C) 99.1 F (37.3 C)  TempSrc:  Oral Oral Oral  SpO2: 98% 94% 100% 97%   General: Pt is awake, not in acute distress Cardiovascular: RRR, S1/S2 +, no rubs, no  gallops Respiratory: CTA bilaterally, no wheezing, no rhonchi Abdominal: Soft, NT, ND, bowel sounds + Extremities: no edema, no cyanosis; Left Knee has brace and mild swelling.   The results of significant diagnostics from this hospitalization (including imaging, microbiology, ancillary and laboratory) are listed below for reference.    Microbiology: Recent Results (from the past 240 hour(s))  MRSA PCR Screening     Status: None   Collection Time: 06/26/17  5:12 AM  Result Value Ref Range Status   MRSA by PCR NEGATIVE NEGATIVE Final    Comment:        The GeneXpert MRSA Assay (FDA approved for NASAL specimens only), is one component of a comprehensive MRSA colonization surveillance program. It is not intended to diagnose MRSA infection nor to guide or monitor treatment for MRSA infections.     Labs: BNP (last 3 results) No results for input(s): BNP in the last 8760 hours. Basic Metabolic Panel:  Recent Labs Lab 06/25/17 1755 06/25/17 1809 06/26/17 0550  NA 140 141 142  K 4.3 4.4 4.6  CL 110 109 110  CO2 21*  --  22  GLUCOSE 102* 99 112*  BUN 31* 29* 26*  CREATININE 1.29* 1.20* 1.30*  CALCIUM 8.7*  --  9.3   Liver Function Tests:  Recent Labs Lab 06/25/17 1755  AST 21  ALT 18  ALKPHOS 74  BILITOT 0.5  PROT 7.1  ALBUMIN 4.2   No results for input(s): LIPASE, AMYLASE in the last 168 hours. No results for input(s): AMMONIA in the last 168 hours. CBC:  Recent Labs Lab 06/25/17 1755 06/25/17 1809 06/26/17 0550  WBC 9.1  --  7.1  NEUTROABS 6.1  --   --   HGB 11.9* 11.9* 12.2  HCT 34.6* 35.0* 36.3  MCV 90.1  --  89.0  PLT 189  --  204   Cardiac Enzymes: No results for input(s): CKTOTAL, CKMB, CKMBINDEX, TROPONINI in the last 168 hours. BNP: Invalid input(s):  POCBNP CBG: No results for input(s): GLUCAP in the last 168 hours. D-Dimer No results for input(s): DDIMER in the last 72 hours. Hgb A1c No results for input(s): HGBA1C in the last 72  hours. Lipid Profile No results for input(s): CHOL, HDL, LDLCALC, TRIG, CHOLHDL, LDLDIRECT in the last 72 hours. Thyroid function studies No results for input(s): TSH, T4TOTAL, T3FREE, THYROIDAB in the last 72 hours.  Invalid input(s): FREET3 Anemia work up No results for input(s): VITAMINB12, FOLATE, FERRITIN, TIBC, IRON, RETICCTPCT in the last 72 hours. Urinalysis    Component Value Date/Time   COLORURINE YELLOW 06/25/2017 1746   APPEARANCEUR CLOUDY (A) 06/25/2017 1746   LABSPEC 1.018 06/25/2017 1746   PHURINE 5.0 06/25/2017 1746   GLUCOSEU NEGATIVE 06/25/2017 1746   HGBUR NEGATIVE 06/25/2017 1746   BILIRUBINUR NEGATIVE 06/25/2017 1746   KETONESUR 5 (A) 06/25/2017 1746   PROTEINUR 30 (A) 06/25/2017 1746   UROBILINOGEN 0.2 07/08/2007 1727   NITRITE NEGATIVE 06/25/2017 1746   LEUKOCYTESUR LARGE (A) 06/25/2017 1746   Sepsis Labs Invalid input(s): PROCALCITONIN,  WBC,  LACTICIDVEN Microbiology Recent Results (from the past 240 hour(s))  MRSA PCR Screening     Status: None   Collection Time: 06/26/17  5:12 AM  Result Value Ref Range Status   MRSA by PCR NEGATIVE NEGATIVE Final    Comment:        The GeneXpert MRSA Assay (FDA approved for NASAL specimens only), is one component of a comprehensive MRSA colonization surveillance program. It is not intended to diagnose MRSA infection nor to guide or monitor treatment for MRSA infections.    Time coordinating discharge: 35 minutes  SIGNED:  Kerney Elbe, DO [facility] 06/26/2017, 2:30 PM Pager [phone]  If 7PM-7AM, please contact night-coverage [email or web address] Password [email or web address]

## 2017-06-26 NOTE — Evaluation (Signed)
Physical Therapy Evaluation Patient Details Name: Nancy Rowland MRN: [id number] DOB: 1929/01/17 Today's Date: 06/26/2017   History of Present Illness  Pt s/p fall with L patella fx - no surgical intervention planned - Bledsoe in place.  Pt with hx of CVA and dementia  Clinical Impression  Pt admitted as above and presenting with functional mobility limitations 2* dementia related cognition, anxiety, and L LE immobilization.  Pt would benefit from follow up rehab at SNF level.    Follow Up Recommendations SNF    Equipment Recommendations  None recommended by PT    Recommendations for Other Services       Precautions / Restrictions Precautions Precautions: Fall Required Braces or Orthoses: Knee Immobilizer - Left Knee Immobilizer - Left: On at all times Restrictions Weight Bearing Restrictions: No      Mobility  Bed Mobility Overal bed mobility: Needs Assistance Bed Mobility: Supine to Sit;Sit to Supine     Supine to sit: Mod assist;+2 for physical assistance;+2 for safety/equipment Sit to supine: Mod assist;+2 for physical assistance;+2 for safety/equipment   General bed mobility comments: Multimodal cues required to gain pt participation  Transfers                 General transfer comment: Pt sat on EOB only.  Unwilling to attempt standing.  "I just dont want to be involved in that"  Ambulation/Gait                Stairs            Wheelchair Mobility    Modified Rankin (Stroke Patients Only)       Balance Overall balance assessment: Needs assistance Sitting-balance support: Bilateral upper extremity supported;Feet supported Sitting balance-Leahy Scale: Fair                                       Pertinent Vitals/Pain Pain Assessment: Faces    Home Living Family/patient expects to be discharged to:: Assisted living                      Prior Function Level of Independence: Needs assistance          Comments: Pt from ALF, pt unable to provide reliable hx 2* dementia     Hand Dominance        Extremity/Trunk Assessment   Upper Extremity Assessment Upper Extremity Assessment: Generalized weakness    Lower Extremity Assessment Lower Extremity Assessment: Generalized weakness    Cervical / Trunk Assessment Cervical / Trunk Assessment: Kyphotic  Communication   Communication: HOH  Cognition Arousal/Alertness: Awake/alert Behavior During Therapy: Anxious Overall Cognitive Status: History of cognitive impairments - at baseline                                        General Comments      Exercises     Assessment/Plan    PT Assessment Patient needs continued PT services  PT Problem List Decreased strength;Decreased range of motion;Decreased activity tolerance;Decreased balance;Decreased mobility;Decreased cognition;Decreased knowledge of use of DME       PT Treatment Interventions DME instruction;Gait training;Functional mobility training;Therapeutic activities;Therapeutic exercise;Balance training;Patient/family education    PT Goals (Current goals can be found in the Care Plan section)  Acute Rehab PT Goals Patient Stated Goal: No goals  stated PT Goal Formulation: Patient unable to participate in goal setting Time For Goal Achievement: 07/06/17 Potential to Achieve Goals: Fair    Frequency Min 2X/week   Barriers to discharge        Co-evaluation PT/OT/SLP Co-Evaluation/Treatment: Yes Reason for Co-Treatment: For patient/therapist safety PT goals addressed during session: Mobility/safety with mobility OT goals addressed during session: ADL's and self-care       AM-PAC PT "6 Clicks" Daily Activity  Outcome Measure Difficulty turning over in bed (including adjusting bedclothes, sheets and blankets)?: Total Difficulty moving from lying on back to sitting on the side of the bed? : Total Difficulty sitting down on and standing up from a  chair with arms (e.g., wheelchair, bedside commode, etc,.)?: Total Help needed moving to and from a bed to chair (including a wheelchair)?: A Lot Help needed walking in hospital room?: A Lot Help needed climbing 3-5 steps with a railing? : Total 6 Click Score: 8    End of Session   Activity Tolerance: Other (comment) (ltd by anxiety) Patient left: in bed;with call bell/phone within reach;with bed alarm set Nurse Communication: Mobility status PT Visit Diagnosis: History of falling (Z91.81);Muscle weakness (generalized) (M62.81);Difficulty in walking, not elsewhere classified (R26.2)    Time: 0272-5366 PT Time Calculation (min) (ACUTE ONLY): 16 min   Charges:   PT Evaluation $PT Eval Moderate Complexity: 1 Procedure     PT G Codes:        Pg [phone]   Yesena Reaves 06/26/2017, 1:02 PM

## 2017-06-27 LAB — URINE CULTURE

## 2017-06-27 NOTE — Progress Notes (Signed)
   06/26/17 1200  PT Time Calculation  PT Start Time (ACUTE ONLY) 1150  PT Stop Time (ACUTE ONLY) 1206  PT Time Calculation (min) (ACUTE ONLY) 16 min  PT G-Codes **NOT FOR INPATIENT CLASS**  Functional Assessment Tool Used AM-PAC 6 Clicks Basic Mobility  Functional Limitation Mobility: Walking and moving around  Mobility: Walking and Moving Around Current Status (Q6386) CM  Mobility: Walking and Moving Around Goal Status (U5488) CL  PT General Charges  $$ ACUTE PT VISIT 1 Procedure  PT Evaluation  $PT Eval Moderate Complexity 1 Procedure

## 2017-07-04 ENCOUNTER — Telehealth (INDEPENDENT_AMBULATORY_CARE_PROVIDER_SITE_OTHER): Payer: Self-pay | Admitting: Orthopaedic Surgery

## 2017-07-04 NOTE — Telephone Encounter (Signed)
Occupational Therapy Plan of Care faxed 07/03/17

## 2017-07-15 ENCOUNTER — Encounter (INDEPENDENT_AMBULATORY_CARE_PROVIDER_SITE_OTHER): Payer: Self-pay | Admitting: Orthopaedic Surgery

## 2017-07-15 ENCOUNTER — Ambulatory Visit (INDEPENDENT_AMBULATORY_CARE_PROVIDER_SITE_OTHER): Payer: Medicare Other | Admitting: Orthopaedic Surgery

## 2017-07-15 DIAGNOSIS — S82045A Nondisplaced comminuted fracture of left patella, initial encounter for closed fracture: Secondary | ICD-10-CM | POA: Insufficient documentation

## 2017-07-15 NOTE — Progress Notes (Signed)
Office Visit Note   Patient: Nancy Rowland           Date of Birth: Mar 24, 1929           MRN: [id number] Visit Date: 07/15/2017              Requested by: No referring provider defined for this encounter. PCP: System, Pcp Not In   Assessment & Plan: Visit Diagnoses:  1. Nondisplaced comminuted fracture of left patella, initial encounter for closed fracture     Plan: Overall impression is nondisplaced comminuted left patella fracture. This should be amenable to nonoperative treatment with a Bledsoe brace. Weight-bear as tolerated in the Bledsoe brace. Brace was opened up to 0-30. Follow-up in 3 weeks with 2 view x-rays of the left knee.  Follow-Up Instructions: Return in about 3 weeks (around 08/05/2017).   Orders:  No orders of the defined types were placed in this encounter.  No orders of the defined types were placed in this encounter.     Procedures: No procedures performed   Clinical Data: No additional findings.   Subjective: Chief Complaint  Patient presents with  . Left Wrist - Pain    Patient comes in today with a new fracture of her left patella from an unwitnessed fall. This happened about 3 weeks ago. She has dementia. She is here with her daughter.    Review of Systems  Constitutional: Negative.   HENT: Negative.   Eyes: Negative.   Respiratory: Negative.   Cardiovascular: Negative.   Endocrine: Negative.   Musculoskeletal: Negative.   Neurological: Negative.   Hematological: Negative.   Psychiatric/Behavioral: Negative.   All other systems reviewed and are negative.    Objective: Vital Signs: There were no vitals taken for this visit.  Physical Exam  Constitutional: She is oriented to person, place, and time. She appears well-developed and well-nourished.  Pulmonary/Chest: Effort normal.  Neurological: She is alert and oriented to person, place, and time.  Skin: Skin is warm. Capillary refill takes less than 2 seconds.  Psychiatric:  She has a normal mood and affect. Her behavior is normal. Judgment and thought content normal.  Nursing note and vitals reviewed.   Ortho Exam Left knee exam shows no traumatic skin lesions. No significant swelling. Tenderness over the patella. Range of motion deferred. Specialty Comments:  No specialty comments available.  Imaging: No results found.   PMFS History: Patient Active Problem List   Diagnosis Date Noted  . Nondisplaced comminuted fracture of left patella, initial encounter for closed fracture 07/15/2017  . Pyuria 06/26/2017  . Dementia 06/25/2017  . Fall 06/25/2017  . Closed fracture of left distal radius 05/27/2017  . Ischemic stroke ([place])   . History of stroke   . HLD (hyperlipidemia)   . Essential hypertension   . TIA (transient ischemic attack) 11/02/2015   Past Medical History:  Diagnosis Date  . Cancer ([place])   . Stroke [facility]) 08/2014    No family history on file.  Past Surgical History:  Procedure Laterality Date  . ABDOMINAL HYSTERECTOMY    . TEE WITHOUT CARDIOVERSION N/A 11/04/2015   Procedure: TRANSESOPHAGEAL ECHOCARDIOGRAM (TEE);  Surgeon: Skeet Latch, MD;  Location: [place];  Service: Cardiovascular;  Laterality: N/A;   Social History   Occupational History  . Not on file.   Social History Main Topics  . Smoking status: Never Smoker  . Smokeless tobacco: Never Used  . Alcohol use Not on file  . Drug use: Unknown  .  Sexual activity: Not on file

## 2017-07-22 ENCOUNTER — Telehealth (INDEPENDENT_AMBULATORY_CARE_PROVIDER_SITE_OTHER): Payer: Self-pay | Admitting: Orthopaedic Surgery

## 2017-07-22 NOTE — Telephone Encounter (Signed)
Lottie Mussel (PT) called left voicemail message needing to know if there are any post precautions for the patient and if knee immoblizer should stay in place. The number to contact Beckie Busing is [phone]

## 2017-07-23 NOTE — Telephone Encounter (Signed)
Called Monica back no answer, LMOM read to her last OV NOTE.

## 2017-08-05 ENCOUNTER — Ambulatory Visit (INDEPENDENT_AMBULATORY_CARE_PROVIDER_SITE_OTHER): Payer: Medicare Other | Admitting: Orthopaedic Surgery

## 2017-08-05 ENCOUNTER — Ambulatory Visit (INDEPENDENT_AMBULATORY_CARE_PROVIDER_SITE_OTHER): Payer: Medicare Other

## 2017-08-05 ENCOUNTER — Encounter (INDEPENDENT_AMBULATORY_CARE_PROVIDER_SITE_OTHER): Payer: Self-pay | Admitting: Orthopaedic Surgery

## 2017-08-05 DIAGNOSIS — G8929 Other chronic pain: Secondary | ICD-10-CM

## 2017-08-05 DIAGNOSIS — M25562 Pain in left knee: Secondary | ICD-10-CM

## 2017-08-05 DIAGNOSIS — S82045A Nondisplaced comminuted fracture of left patella, initial encounter for closed fracture: Secondary | ICD-10-CM

## 2017-08-05 NOTE — Progress Notes (Signed)
Patient is 6 weeks status post nondisplaced left patella fracture. She is doing well. No complaints of pain. Exam is relatively benign. No swelling or bruising. Patella is nontender. 2 view x-rays left knee show stable alignment of the patella fracture. No worsening or gapping. Follow-up in 6 weeks with lateral knee x-ray. May begin to wean Bledsoe brace with physical therapy. Strengthening and range of motion as tolerated.

## 2017-09-16 ENCOUNTER — Ambulatory Visit (INDEPENDENT_AMBULATORY_CARE_PROVIDER_SITE_OTHER): Payer: Medicare Other | Admitting: Orthopaedic Surgery

## 2017-09-16 ENCOUNTER — Ambulatory Visit (INDEPENDENT_AMBULATORY_CARE_PROVIDER_SITE_OTHER): Payer: Medicare Other

## 2017-09-16 ENCOUNTER — Encounter (INDEPENDENT_AMBULATORY_CARE_PROVIDER_SITE_OTHER): Payer: Self-pay | Admitting: Orthopaedic Surgery

## 2017-09-16 DIAGNOSIS — G8929 Other chronic pain: Secondary | ICD-10-CM

## 2017-09-16 DIAGNOSIS — M25562 Pain in left knee: Secondary | ICD-10-CM

## 2017-09-16 DIAGNOSIS — S82045A Nondisplaced comminuted fracture of left patella, initial encounter for closed fracture: Secondary | ICD-10-CM

## 2017-09-16 NOTE — Progress Notes (Signed)
   Office Visit Note   Patient: Nancy Rowland           Date of Birth: 1929/03/20           MRN: [id number] Visit Date: 09/16/2017              Requested by: No referring provider defined for this encounter. PCP: System, Pcp Not In   Assessment & Plan: Visit Diagnoses:  1. Chronic pain of left knee   2. Nondisplaced comminuted fracture of left patella, initial encounter for closed fracture     Plan: Patient has a healed patella fracture. Clinically she is also doing well. Continue with physical therapy for strengthening and joint mobilization. Questions encouraged and answered. Follow-up as needed.  Follow-Up Instructions: Return if symptoms worsen or fail to improve.   Orders:  Orders Placed This Encounter  Procedures  . XR Knee 1-2 Views Left   No orders of the defined types were placed in this encounter.     Procedures: No procedures performed   Clinical Data: No additional findings.   Subjective: Chief Complaint  Patient presents with  . Left Knee - Pain, Follow-up    Patient comes in today with her 2 daughters for follow-up on her left patella fracture. Overall she is doing well. She has been able to ambulate with physical therapy.  She denies any significant pain.    Review of Systems   Objective: Vital Signs: There were no vitals taken for this visit.  Physical Exam  Ortho Exam Left knee exam shows no joint effusion. No pain with palpation over the patella. Specialty Comments:  No specialty comments available.  Imaging: Xr Knee 1-2 Views Left  Result Date: 09/16/2017 Healed left patella fracture    PMFS History: Patient Active Problem List   Diagnosis Date Noted  . Nondisplaced comminuted fracture of left patella, initial encounter for closed fracture 07/15/2017  . Pyuria 06/26/2017  . Dementia 06/25/2017  . Fall 06/25/2017  . Closed fracture of left distal radius 05/27/2017  . Ischemic stroke ([place])   . History of stroke   . HLD  (hyperlipidemia)   . Essential hypertension   . TIA (transient ischemic attack) 11/02/2015   Past Medical History:  Diagnosis Date  . Cancer ([place])   . Stroke [facility]) 08/2014    No family history on file.  Past Surgical History:  Procedure Laterality Date  . ABDOMINAL HYSTERECTOMY    . TEE WITHOUT CARDIOVERSION N/A 11/04/2015   Procedure: TRANSESOPHAGEAL ECHOCARDIOGRAM (TEE);  Surgeon: Skeet Latch, MD;  Location: [place];  Service: Cardiovascular;  Laterality: N/A;   Social History   Occupational History  . Not on file.   Social History Main Topics  . Smoking status: Never Smoker  . Smokeless tobacco: Never Used  . Alcohol use Not on file  . Drug use: Unknown  . Sexual activity: Not on file

## 2018-02-11 ENCOUNTER — Emergency Department (HOSPITAL_COMMUNITY): Payer: Medicare Other

## 2018-02-11 ENCOUNTER — Encounter (HOSPITAL_COMMUNITY): Payer: Self-pay

## 2018-02-11 ENCOUNTER — Inpatient Hospital Stay (HOSPITAL_COMMUNITY)
Admission: EM | Admit: 2018-02-11 | Discharge: 2018-02-14 | DRG: 066 | Disposition: A | Discharge status: Home Health | Payer: Medicare Other | Attending: Internal Medicine | Admitting: Internal Medicine

## 2018-02-11 ENCOUNTER — Other Ambulatory Visit: Payer: Self-pay

## 2018-02-11 DIAGNOSIS — I639 Cerebral infarction, unspecified: Principal | ICD-10-CM | POA: Diagnosis present

## 2018-02-11 DIAGNOSIS — F419 Anxiety disorder, unspecified: Secondary | ICD-10-CM | POA: Diagnosis present

## 2018-02-11 DIAGNOSIS — R29706 NIHSS score 6: Secondary | ICD-10-CM | POA: Diagnosis not present

## 2018-02-11 DIAGNOSIS — Z8673 Personal history of transient ischemic attack (TIA), and cerebral infarction without residual deficits: Secondary | ICD-10-CM

## 2018-02-11 DIAGNOSIS — H9190 Unspecified hearing loss, unspecified ear: Secondary | ICD-10-CM | POA: Diagnosis present

## 2018-02-11 DIAGNOSIS — F0280 Dementia in other diseases classified elsewhere without behavioral disturbance: Secondary | ICD-10-CM | POA: Diagnosis not present

## 2018-02-11 DIAGNOSIS — I129 Hypertensive chronic kidney disease with stage 1 through stage 4 chronic kidney disease, or unspecified chronic kidney disease: Secondary | ICD-10-CM | POA: Diagnosis present

## 2018-02-11 DIAGNOSIS — G8929 Other chronic pain: Secondary | ICD-10-CM | POA: Diagnosis present

## 2018-02-11 DIAGNOSIS — F329 Major depressive disorder, single episode, unspecified: Secondary | ICD-10-CM | POA: Diagnosis present

## 2018-02-11 DIAGNOSIS — I6782 Cerebral ischemia: Secondary | ICD-10-CM | POA: Diagnosis present

## 2018-02-11 DIAGNOSIS — I10 Essential (primary) hypertension: Secondary | ICD-10-CM | POA: Diagnosis not present

## 2018-02-11 DIAGNOSIS — I739 Peripheral vascular disease, unspecified: Secondary | ICD-10-CM | POA: Diagnosis present

## 2018-02-11 DIAGNOSIS — Z823 Family history of stroke: Secondary | ICD-10-CM

## 2018-02-11 DIAGNOSIS — F32A Depression, unspecified: Secondary | ICD-10-CM | POA: Diagnosis present

## 2018-02-11 DIAGNOSIS — E785 Hyperlipidemia, unspecified: Secondary | ICD-10-CM | POA: Diagnosis present

## 2018-02-11 DIAGNOSIS — N183 Chronic kidney disease, stage 3 unspecified: Secondary | ICD-10-CM | POA: Diagnosis present

## 2018-02-11 DIAGNOSIS — F0390 Unspecified dementia without behavioral disturbance: Secondary | ICD-10-CM | POA: Diagnosis present

## 2018-02-11 DIAGNOSIS — R29705 NIHSS score 5: Secondary | ICD-10-CM | POA: Diagnosis present

## 2018-02-11 DIAGNOSIS — Z90710 Acquired absence of both cervix and uterus: Secondary | ICD-10-CM

## 2018-02-11 DIAGNOSIS — Z79899 Other long term (current) drug therapy: Secondary | ICD-10-CM

## 2018-02-11 DIAGNOSIS — R4701 Aphasia: Secondary | ICD-10-CM | POA: Diagnosis present

## 2018-02-11 DIAGNOSIS — Z7902 Long term (current) use of antithrombotics/antiplatelets: Secondary | ICD-10-CM

## 2018-02-11 DIAGNOSIS — I63412 Cerebral infarction due to embolism of left middle cerebral artery: Secondary | ICD-10-CM

## 2018-02-11 DIAGNOSIS — R262 Difficulty in walking, not elsewhere classified: Secondary | ICD-10-CM | POA: Diagnosis not present

## 2018-02-11 DIAGNOSIS — Z66 Do not resuscitate: Secondary | ICD-10-CM | POA: Diagnosis present

## 2018-02-11 DIAGNOSIS — G309 Alzheimer's disease, unspecified: Secondary | ICD-10-CM | POA: Diagnosis not present

## 2018-02-11 HISTORY — DX: Depression, unspecified: F32.A

## 2018-02-11 HISTORY — DX: Unspecified dementia, unspecified severity, without behavioral disturbance, psychotic disturbance, mood disturbance, and anxiety: F03.90

## 2018-02-11 HISTORY — DX: Major depressive disorder, single episode, unspecified: F32.9

## 2018-02-11 LAB — COMPREHENSIVE METABOLIC PANEL
ALBUMIN: 3.7 g/dL (ref 3.5–5.0)
ALK PHOS: 77 U/L (ref 38–126)
ALT: 17 U/L (ref 14–54)
AST: 21 U/L (ref 15–41)
Anion gap: 9 (ref 5–15)
BILIRUBIN TOTAL: 0.4 mg/dL (ref 0.3–1.2)
BUN: 25 mg/dL — ABNORMAL HIGH (ref 6–20)
CALCIUM: 8.6 mg/dL — AB (ref 8.9–10.3)
CO2: 19 mmol/L — ABNORMAL LOW (ref 22–32)
CREATININE: 1.18 mg/dL — AB (ref 0.44–1.00)
Chloride: 112 mmol/L — ABNORMAL HIGH (ref 101–111)
GFR calc Af Amer: 47 mL/min — ABNORMAL LOW (ref >60)
GFR, EST NON AFRICAN AMERICAN: 41 mL/min — AB (ref >60)
GLUCOSE: 177 mg/dL — AB (ref 65–99)
Potassium: 4.1 mmol/L (ref 3.5–5.1)
Sodium: 140 mmol/L (ref 135–145)
TOTAL PROTEIN: 6.2 g/dL — AB (ref 6.5–8.1)

## 2018-02-11 LAB — I-STAT CHEM 8, ED
BUN: 28 mg/dL — AB (ref 6–20)
CREATININE: 1.2 mg/dL — AB (ref 0.44–1.00)
Calcium, Ion: 1.12 mmol/L — ABNORMAL LOW (ref 1.15–1.40)
Chloride: 112 mmol/L — ABNORMAL HIGH (ref 101–111)
GLUCOSE: 172 mg/dL — AB (ref 65–99)
HEMATOCRIT: 32 % — AB (ref 36.0–46.0)
HEMOGLOBIN: 10.9 g/dL — AB (ref 12.0–15.0)
Potassium: 4.1 mmol/L (ref 3.5–5.1)
Sodium: 143 mmol/L (ref 135–145)
TCO2: 20 mmol/L — AB (ref 22–32)

## 2018-02-11 LAB — CBC
HCT: 35.9 % — ABNORMAL LOW (ref 36.0–46.0)
HEMOGLOBIN: 11.3 g/dL — AB (ref 12.0–15.0)
MCH: 29.7 pg (ref 26.0–34.0)
MCHC: 31.5 g/dL (ref 30.0–36.0)
MCV: 94.2 fL (ref 78.0–100.0)
Platelets: 176 10*3/uL (ref 150–400)
RBC: 3.81 MIL/uL — AB (ref 3.87–5.11)
RDW: 14.8 % (ref 11.5–15.5)
WBC: 7.9 10*3/uL (ref 4.0–10.5)

## 2018-02-11 LAB — DIFFERENTIAL
BASOS ABS: 0 10*3/uL (ref 0.0–0.1)
Basophils Relative: 0 %
Eosinophils Absolute: 0.3 10*3/uL (ref 0.0–0.7)
Eosinophils Relative: 4 %
LYMPHS ABS: 2 10*3/uL (ref 0.7–4.0)
LYMPHS PCT: 25 %
MONOS PCT: 9 %
Monocytes Absolute: 0.7 10*3/uL (ref 0.1–1.0)
NEUTROS PCT: 62 %
Neutro Abs: 4.9 10*3/uL (ref 1.7–7.7)

## 2018-02-11 LAB — I-STAT TROPONIN, ED: TROPONIN I, POC: 0.01 ng/mL (ref 0.00–0.08)

## 2018-02-11 LAB — PROTIME-INR
INR: 1.05
Prothrombin Time: 13.6 seconds (ref 11.4–15.2)

## 2018-02-11 LAB — APTT: APTT: 25 s (ref 24–36)

## 2018-02-11 LAB — ETHANOL: Alcohol, Ethyl (B): <10 mg/dL (ref <10)

## 2018-02-11 MED ORDER — CALCIUM CARBONATE-VITAMIN D 500-200 MG-UNIT PO TABS
1.0000 | ORAL_TABLET | Freq: Two times a day (BID) | ORAL | Status: DC
  Administered 2018-02-12 – 2018-02-14 (×5): 1 via ORAL
  Filled 2018-02-11 (×6): qty 1

## 2018-02-11 MED ORDER — ACETAMINOPHEN 650 MG RE SUPP: 650.0000 mg | RECTAL | Status: DC | PRN

## 2018-02-11 MED ORDER — CLOPIDOGREL BISULFATE 75 MG PO TABS
75.0000 mg | ORAL_TABLET | Freq: Every day | ORAL | Status: DC
  Administered 2018-02-12 – 2018-02-14 (×3): 75 mg via ORAL
  Filled 2018-02-11 (×3): qty 1

## 2018-02-11 MED ORDER — SERTRALINE HCL 100 MG PO TABS
100.0000 mg | ORAL_TABLET | Freq: Every day | ORAL | Status: DC
  Administered 2018-02-12 – 2018-02-14 (×3): 100 mg via ORAL
  Filled 2018-02-11 (×3): qty 1

## 2018-02-11 MED ORDER — MELATONIN 3 MG PO TABS
3.0000 mg | ORAL_TABLET | Freq: Every day | ORAL | Status: DC
  Administered 2018-02-12 – 2018-02-13 (×3): 3 mg via ORAL
  Filled 2018-02-11 (×4): qty 1

## 2018-02-11 MED ORDER — SODIUM CHLORIDE 0.9 % IV SOLN: INTRAVENOUS | Status: DC

## 2018-02-11 MED ORDER — SODIUM CHLORIDE 0.9 % IV SOLN
INTRAVENOUS | Status: DC
  Administered 2018-02-11: 100 mL via INTRAVENOUS
  Administered 2018-02-13 – 2018-02-14 (×4): via INTRAVENOUS

## 2018-02-11 MED ORDER — IOPAMIDOL (ISOVUE-370) INJECTION 76%
100.0000 mL | Freq: Once | INTRAVENOUS | Status: AC | PRN
  Administered 2018-02-11: 100 mL via INTRAVENOUS

## 2018-02-11 MED ORDER — ACETAMINOPHEN 160 MG/5ML PO SOLN: 650.0000 mg | ORAL | Status: DC | PRN

## 2018-02-11 MED ORDER — HYDRALAZINE HCL 20 MG/ML IJ SOLN: 5.0000 mg | INTRAMUSCULAR | Status: DC | PRN

## 2018-02-11 MED ORDER — BUSPIRONE HCL 15 MG PO TABS
30.0000 mg | ORAL_TABLET | Freq: Two times a day (BID) | ORAL | Status: DC
  Administered 2018-02-12 – 2018-02-14 (×6): 30 mg via ORAL
  Filled 2018-02-11: qty 3
  Filled 2018-02-11 (×6): qty 2

## 2018-02-11 MED ORDER — STROKE: EARLY STAGES OF RECOVERY BOOK
Freq: Once | Status: AC
  Administered 2018-02-12
  Filled 2018-02-11: qty 1

## 2018-02-11 MED ORDER — ONDANSETRON HCL 4 MG/2ML IJ SOLN: 4.0000 mg | Freq: Three times a day (TID) | INTRAMUSCULAR | Status: DC | PRN

## 2018-02-11 MED ORDER — OXYCODONE-ACETAMINOPHEN 5-325 MG PO TABS: 0.5000 | ORAL_TABLET | Freq: Four times a day (QID) | ORAL | Status: DC | PRN

## 2018-02-11 MED ORDER — ARIPIPRAZOLE 2 MG PO TABS
1.0000 mg | ORAL_TABLET | Freq: Every day | ORAL | Status: DC
  Administered 2018-02-12 – 2018-02-14 (×3): 1 mg via ORAL
  Filled 2018-02-11 (×3): qty 1

## 2018-02-11 MED ORDER — ACETAMINOPHEN 325 MG PO TABS
650.0000 mg | ORAL_TABLET | ORAL | Status: DC | PRN
  Administered 2018-02-12 – 2018-02-14 (×2): 650 mg via ORAL
  Filled 2018-02-11 (×2): qty 2

## 2018-02-11 MED ORDER — SENNOSIDES-DOCUSATE SODIUM 8.6-50 MG PO TABS
1.0000 | ORAL_TABLET | Freq: Every day | ORAL | Status: DC
  Administered 2018-02-12 – 2018-02-13 (×3): 1 via ORAL
  Filled 2018-02-11 (×3): qty 1

## 2018-02-11 MED ORDER — ZOLPIDEM TARTRATE 5 MG PO TABS: 5.0000 mg | ORAL_TABLET | Freq: Every evening | ORAL | Status: DC | PRN

## 2018-02-11 MED ORDER — ENOXAPARIN SODIUM 40 MG/0.4ML ~~LOC~~ SOLN
40.0000 mg | SUBCUTANEOUS | Status: DC
  Administered 2018-02-12 – 2018-02-14 (×3): 40 mg via SUBCUTANEOUS
  Filled 2018-02-11 (×3): qty 0.4

## 2018-02-11 MED ORDER — ATORVASTATIN CALCIUM 80 MG PO TABS
80.0000 mg | ORAL_TABLET | Freq: Every day | ORAL | Status: DC
  Administered 2018-02-12 – 2018-02-13 (×3): 80 mg via ORAL
  Filled 2018-02-11 (×3): qty 1

## 2018-02-11 NOTE — H&P (Addendum)
History and Physical    Nancy Rowland LZJ:[id number] DOB: 1929-11-29 DOA: 02/11/2018  Referring MD/NP/PA:   PCP: System, Pcp Not In   Patient coming from:  The patient is coming from SNF.  At baseline, pt is dependent for most of ADL.  Chief Complaint: aphasia   HPI: Nancy Rowland is a 84 y.o. female with medical history significant of stroke, hypertension, hyperlipidemia, depression, dementia, CK-3, who presents with aphasia.  Per daughter, patient was last known normal about 5:20 PM 02/11/18. She was noted to have difficulty speaking by her daughter after dinner. Pt was able to follow commands but not able to talk normally. She does not have unilateral weakness, numbness in extremities, no facial droop or vision change. She has chronic hearing loss. Patient does not have chest pain, shortness breath, cough, nausea, vomiting, diarrhea, abdominal pain. No symptoms of UTI. No fever or chills.  ED Course: pt was found to have WBC 7.9, INR 1.05, negative troponin, stable renal function, temperature normal, no tachycardia, oxygen saturation 99% on room air. Pt is admitted to tele bed as inpt. Dr. Rory Percy of neuro was consulted.   # CT-head showed: Left lateral temporal lobe region of hypoattenuation likely representing late acute to subacute infarction. No hemorrhage or mass effect; and multiple stable chronic infarctions, chronic microvascular ischemic changes, and parenchymal volume loss of the brain are otherwise stable.  # MRI of brain showed: Subacute left lateral temporal lobe infarction, approximately 15 cc. Mild local mass effect. No hemorrhage. No additional acute or early subacute infarction of the brain. Multiple additional stable chronic infarcts, chronic microvascular ischemic changes, parenchymal volume loss.  # CTA of head and neck and brain perfusion showed no large vessel occlusion, aneurysm, or significant stenosis.  Review of Systems:   General: no fevers, chills, no body weight  gain, has fatigue HEENT: no blurry vision, hearing changes or sore throat Respiratory: no dyspnea, coughing, wheezing CV: no chest pain, no palpitations GI: no nausea, vomiting, abdominal pain, diarrhea, constipation GU: no dysuria, burning on urination, increased urinary frequency, hematuria  Ext: no leg edema Neuro: no unilateral weakness, numbness, or tingling, no vision change. Has poor hearing. Has aphasia. Skin: no rash, no skin tear. MSK: No muscle spasm, no deformity, no limitation of range of movement in spin Heme: No easy bruising.  Travel history: No recent long distant travel.  Allergy: No Known Allergies  Past Medical History:  Diagnosis Date  . Cancer ([place])   . Dementia   . Depression   . Stroke [facility]) 08/2014    Past Surgical History:  Procedure Laterality Date  . ABDOMINAL HYSTERECTOMY    . TEE WITHOUT CARDIOVERSION N/A 11/04/2015   Procedure: TRANSESOPHAGEAL ECHOCARDIOGRAM (TEE);  Surgeon: Skeet Latch, MD;  Location: [facility] ENDOSCOPY;  Service: Cardiovascular;  Laterality: N/A;    Social History:  reports that  has never smoked. she has never used smokeless tobacco. Her alcohol and drug histories are not on file.  Family History:  Family History  Problem Relation Age of Onset  . Stroke Mother   . Diabetes Mellitus II Father      Prior to Admission medications   Medication Sig Start Date End Date Taking? Authorizing Provider  alendronate (FOSAMAX) 70 MG tablet Take 70 mg by mouth once a week. Take with a full glass of water on an empty stomach.   Yes [provider]  ARIPiprazole (ABILIFY) 2 MG tablet Take 1 mg by mouth daily.   Yes [provider]  atorvastatin (LIPITOR) 10 MG tablet Take 10 mg by mouth daily.   Yes [provider]  busPIRone (BUSPAR) 30 MG tablet Take 30 mg by mouth 2 (two) times daily.   Yes [provider]  Calcium Carbonate-Vitamin D3 (CALCIUM 600-D) 600-400 MG-UNIT TABS Take 1 tablet by mouth 2  (two) times daily.   Yes [provider]  clopidogrel (PLAVIX) 75 MG tablet Take 1 tablet (75 mg total) by mouth daily. 11/04/15  Yes Ahmed, Chesley Mires, MD  losartan (COZAAR) 25 MG tablet Take 12.5 mg by mouth daily.    Yes [provider]  Melatonin 3 MG TABS Take 3 mg by mouth at bedtime.   Yes [provider]  oxyCODONE-acetaminophen (PERCOCET/ROXICET) 5-325 MG tablet Take 0.5 tablets by mouth every 6 (six) hours as needed for severe pain. 06/26/17  Yes Sheikh, Omair Latif, DO  senna-docusate (SENOKOT-S) 8.6-50 MG tablet Take 1 tablet by mouth at bedtime. 11/04/15  Yes Ahmed, Chesley Mires, MD  sertraline (ZOLOFT) 100 MG tablet Take 100 mg by mouth daily.   Yes [provider]  calcium-vitamin D (OSCAL WITH D) 500-200 MG-UNIT tablet Take 1 tablet by mouth 2 (two) times daily. Patient not taking: Reported on 02/11/2018 06/26/17   Raiford Noble [place], DO    Physical Exam: Vitals:   02/11/18 2100 02/11/18 2115 02/11/18 2130 02/11/18 2145  BP: (!) 115/48 (!) 99/56 91/64 105/79  Pulse: 72 71 66 73  Resp: 18 18 16 19   Temp:      TempSrc:      SpO2: 99% 99% 100% 98%  Weight:      Height:       General: Not in acute distress HEENT:       Eyes: PERRL, EOMI, no scleral icterus.       ENT: No discharge from the ears and nose, no pharynx injection, no tonsillar enlargement.        Neck: No JVD, no bruit, no mass felt. Heme: No neck lymph node enlargement. Cardiac: S1/S2, RRR, No murmurs, No gallops or rubs. Respiratory: Good air movement bilaterally. No rales, wheezing, rhonchi or rubs. GI: Soft, nondistended, nontender, no rebound pain, no organomegaly, BS present. GU: No hematuria Ext: No pitting leg edema bilaterally. 2+DP/PT pulse bilaterally. Musculoskeletal: No joint deformities, No joint redness or warmth, no limitation of ROM in spin. Skin: No rashes.  Neuro: Alert, oriented X3, cranial nerves II-XII grossly intact, moves all extremities normally. Muscle  strength 5/5 in all extremities, sensation to light touch intact. Brachial reflex 2+ bilaterally. Knee reflex 1+ bilaterally. Negative Babinski's sign. Normal finger to nose test. Psych: Patient is not psychotic, no suicidal or hemocidal ideation.  Labs on Admission: I have personally reviewed following labs and imaging studies  CBC: Recent Labs  Lab 02/11/18 1951 02/11/18 1956  WBC 7.9  --   NEUTROABS 4.9  --   HGB 11.3* 10.9*  HCT 35.9* 32.0*  MCV 94.2  --   PLT 176  --    Basic Metabolic Panel: Recent Labs  Lab 02/11/18 1951 02/11/18 1956  NA 140 143  K 4.1 4.1  CL 112* 112*  CO2 19*  --   GLUCOSE 177* 172*  BUN 25* 28*  CREATININE 1.18* 1.20*  CALCIUM 8.6*  --    GFR: Estimated Creatinine Clearance: 35.3 mL/min (A) (by C-G formula based on SCr of 1.2 mg/dL (H)). Liver Function Tests: Recent Labs  Lab 02/11/18 1951  AST 21  ALT 17  ALKPHOS 77  BILITOT 0.4  PROT 6.2*  ALBUMIN 3.7   No results for input(s): LIPASE, AMYLASE in the last 168 hours. No results for input(s): AMMONIA in the last 168 hours. Coagulation Profile: Recent Labs  Lab 02/11/18 1951  INR 1.05   Cardiac Enzymes: No results for input(s): CKTOTAL, CKMB, CKMBINDEX, TROPONINI in the last 168 hours. BNP (last 3 results) No results for input(s): PROBNP in the last 8760 hours. HbA1C: No results for input(s): HGBA1C in the last 72 hours. CBG: No results for input(s): GLUCAP in the last 168 hours. Lipid Profile: No results for input(s): CHOL, HDL, LDLCALC, TRIG, CHOLHDL, LDLDIRECT in the last 72 hours. Thyroid Function Tests: No results for input(s): TSH, T4TOTAL, FREET4, T3FREE, THYROIDAB in the last 72 hours. Anemia Panel: No results for input(s): VITAMINB12, FOLATE, FERRITIN, TIBC, IRON, RETICCTPCT in the last 72 hours. Urine analysis:    Component Value Date/Time   COLORURINE YELLOW 06/25/2017 1746   APPEARANCEUR CLOUDY (A) 06/25/2017 1746   LABSPEC 1.018 06/25/2017 1746   PHURINE  5.0 06/25/2017 1746   GLUCOSEU NEGATIVE 06/25/2017 1746   HGBUR NEGATIVE 06/25/2017 1746   BILIRUBINUR NEGATIVE 06/25/2017 1746   KETONESUR 5 (A) 06/25/2017 1746   PROTEINUR 30 (A) 06/25/2017 1746   UROBILINOGEN 0.2 07/08/2007 1727   NITRITE NEGATIVE 06/25/2017 1746   LEUKOCYTESUR LARGE (A) 06/25/2017 1746   Sepsis Labs: @LABRCNTIP (procalcitonin:4,lacticidven:4) )No results found for this or any previous visit (from the past 240 hour(s)).   Radiological Exams on Admission: Ct Angio Head W Or Wo Contrast  Result Date: 02/11/2018 CLINICAL DATA:  84 y/o  F; aphasia, no focal weakness. EXAM: CT ANGIOGRAPHY HEAD AND NECK CT PERFUSION BRAIN TECHNIQUE: Multidetector CT imaging of the head and neck was performed using the standard protocol during bolus administration of intravenous contrast. Multiplanar CT image reconstructions and MIPs were obtained to evaluate the vascular anatomy. Carotid stenosis measurements (when applicable) are obtained utilizing NASCET criteria, using the distal internal carotid diameter as the denominator. Multiphase CT imaging of the brain was performed following IV bolus contrast injection. Subsequent parametric perfusion maps were calculated using RAPID software. CONTRAST:  6mL ISOVUE-370 IOPAMIDOL (ISOVUE-370) INJECTION 76% COMPARISON:  02/11/2018 CT head. FINDINGS: CTA NECK FINDINGS Aortic arch: Standard branching. Imaged portion shows no evidence of aneurysm or dissection. No significant stenosis of the major arch vessel origins. Mild calcific atherosclerosis. Right carotid system: No evidence of dissection, stenosis (50% or greater) or occlusion. Left carotid system: No evidence of dissection, stenosis (50% or greater) or occlusion. Vertebral arteries: Left dominant. No evidence of dissection, stenosis (50% or greater) or occlusion. Skeleton: Moderate cervical spondylosis with multilevel disc and facet degenerative changes. Canal stenosis is greatest at the C4 through C6  levels where it is at least moderate. Other neck: Negative. Upper chest: Mild biapical pleuroparenchymal scarring. Review of the MIP images confirms the above findings CTA HEAD FINDINGS Anterior circulation: No significant stenosis, proximal occlusion, aneurysm, or vascular malformation. Non stenotic calcific atherosclerosis of carotid siphons. Posterior circulation: No significant stenosis, proximal occlusion, aneurysm, or vascular malformation. Diminutive left PCA with chronic left posterior PCA territory infarction. Multiple segments of mild stenosis and right PCA. Venous sinuses: As permitted by contrast timing, patent. Anatomic variants: Patent circle-of-Willis. Review of the MIP images confirms the above findings CT Brain Perfusion Findings: CBF (<30%) Volume: 50mL Perfusion (Tmax>6.0s) volume: 39mL Mismatch Volume: 16mL Infarction Location:Negative. IMPRESSION: 1. Patent carotid and vertebral arteries. No dissection, aneurysm, or hemodynamically significant stenosis utilizing NASCET criteria. 2. Patent anterior and posterior intracranial circulation. No  large vessel occlusion, aneurysm, or significant stenosis. 3. Negative CT perfusion head. These results were called by telephone at the time of interpretation on 02/11/2018 at 8:24 pm to Dr. Amie Portland , who verbally acknowledged these results. Electronically Signed   By: Kristine Garbe M.D.   On: 02/11/2018 20:27   Ct Angio Neck W Or Wo Contrast  Result Date: 02/11/2018 CLINICAL DATA:  84 y/o  F; aphasia, no focal weakness. EXAM: CT ANGIOGRAPHY HEAD AND NECK CT PERFUSION BRAIN TECHNIQUE: Multidetector CT imaging of the head and neck was performed using the standard protocol during bolus administration of intravenous contrast. Multiplanar CT image reconstructions and MIPs were obtained to evaluate the vascular anatomy. Carotid stenosis measurements (when applicable) are obtained utilizing NASCET criteria, using the distal internal carotid diameter as  the denominator. Multiphase CT imaging of the brain was performed following IV bolus contrast injection. Subsequent parametric perfusion maps were calculated using RAPID software. CONTRAST:  36mL ISOVUE-370 IOPAMIDOL (ISOVUE-370) INJECTION 76% COMPARISON:  02/11/2018 CT head. FINDINGS: CTA NECK FINDINGS Aortic arch: Standard branching. Imaged portion shows no evidence of aneurysm or dissection. No significant stenosis of the major arch vessel origins. Mild calcific atherosclerosis. Right carotid system: No evidence of dissection, stenosis (50% or greater) or occlusion. Left carotid system: No evidence of dissection, stenosis (50% or greater) or occlusion. Vertebral arteries: Left dominant. No evidence of dissection, stenosis (50% or greater) or occlusion. Skeleton: Moderate cervical spondylosis with multilevel disc and facet degenerative changes. Canal stenosis is greatest at the C4 through C6 levels where it is at least moderate. Other neck: Negative. Upper chest: Mild biapical pleuroparenchymal scarring. Review of the MIP images confirms the above findings CTA HEAD FINDINGS Anterior circulation: No significant stenosis, proximal occlusion, aneurysm, or vascular malformation. Non stenotic calcific atherosclerosis of carotid siphons. Posterior circulation: No significant stenosis, proximal occlusion, aneurysm, or vascular malformation. Diminutive left PCA with chronic left posterior PCA territory infarction. Multiple segments of mild stenosis and right PCA. Venous sinuses: As permitted by contrast timing, patent. Anatomic variants: Patent circle-of-Willis. Review of the MIP images confirms the above findings CT Brain Perfusion Findings: CBF (<30%) Volume: 82mL Perfusion (Tmax>6.0s) volume: 45mL Mismatch Volume: 66mL Infarction Location:Negative. IMPRESSION: 1. Patent carotid and vertebral arteries. No dissection, aneurysm, or hemodynamically significant stenosis utilizing NASCET criteria. 2. Patent anterior and  posterior intracranial circulation. No large vessel occlusion, aneurysm, or significant stenosis. 3. Negative CT perfusion head. These results were called by telephone at the time of interpretation on 02/11/2018 at 8:24 pm to Dr. Amie Portland , who verbally acknowledged these results. Electronically Signed   By: Kristine Garbe M.D.   On: 02/11/2018 20:27   Mr Brain Wo Contrast  Result Date: 02/11/2018 CLINICAL DATA:  84 y/o  F; code stroke, limited study. EXAM: MRI HEAD WITHOUT CONTRAST TECHNIQUE: Axial DWI, coronal DWI, axial T2 FLAIR, axial SWAN sequences were acquired. COMPARISON:  02/11/2018 CT.  11/02/2015 MRI of the head. FINDINGS: Brain: Left lateral temporal lobe region of mildly reduced diffusion measuring 5.4 x 2.6 x 2.0 cm (volume = 15 cm^3) corresponding to hypoattenuation on CT head. The region of reduced diffusion demonstrates T2 FLAIR hyperintensity and mild local mass effect. Findings are compatible with a subacute infarction. No additional reduced diffusion to suggest acute or early subacute infarction. No abnormal susceptibility hypointensity to indicate acute hemorrhage. Chronic infarcts are present within the right parietal lobe, bilateral occipital lobes, and right inferior cerebellar hemisphere. There is a background of mild chronic microvascular ischemic changes and parenchymal  volume loss of the brain. No extra-axial collection or effacement of basilar cisterns. Vascular: Normal flow voids. Skull and upper cervical spine: No abnormal diffusion, FLAIR, or SWI signal. Sinuses/Orbits: Negative. Other: None. IMPRESSION: 1. Subacute left lateral temporal lobe infarction, approximately 15 cc. Mild local mass effect. No hemorrhage. 2. No additional acute or early subacute infarction of the brain. 3. Multiple additional stable chronic infarcts, chronic microvascular ischemic changes, parenchymal volume loss. Electronically Signed   By: Kristine Garbe M.D.   On: 02/11/2018 21:00     Ct Cerebral Perfusion W Contrast  Result Date: 02/11/2018 CLINICAL DATA:  84 y/o  F; aphasia, no focal weakness. EXAM: CT ANGIOGRAPHY HEAD AND NECK CT PERFUSION BRAIN TECHNIQUE: Multidetector CT imaging of the head and neck was performed using the standard protocol during bolus administration of intravenous contrast. Multiplanar CT image reconstructions and MIPs were obtained to evaluate the vascular anatomy. Carotid stenosis measurements (when applicable) are obtained utilizing NASCET criteria, using the distal internal carotid diameter as the denominator. Multiphase CT imaging of the brain was performed following IV bolus contrast injection. Subsequent parametric perfusion maps were calculated using RAPID software. CONTRAST:  65mL ISOVUE-370 IOPAMIDOL (ISOVUE-370) INJECTION 76% COMPARISON:  02/11/2018 CT head. FINDINGS: CTA NECK FINDINGS Aortic arch: Standard branching. Imaged portion shows no evidence of aneurysm or dissection. No significant stenosis of the major arch vessel origins. Mild calcific atherosclerosis. Right carotid system: No evidence of dissection, stenosis (50% or greater) or occlusion. Left carotid system: No evidence of dissection, stenosis (50% or greater) or occlusion. Vertebral arteries: Left dominant. No evidence of dissection, stenosis (50% or greater) or occlusion. Skeleton: Moderate cervical spondylosis with multilevel disc and facet degenerative changes. Canal stenosis is greatest at the C4 through C6 levels where it is at least moderate. Other neck: Negative. Upper chest: Mild biapical pleuroparenchymal scarring. Review of the MIP images confirms the above findings CTA HEAD FINDINGS Anterior circulation: No significant stenosis, proximal occlusion, aneurysm, or vascular malformation. Non stenotic calcific atherosclerosis of carotid siphons. Posterior circulation: No significant stenosis, proximal occlusion, aneurysm, or vascular malformation. Diminutive left PCA with chronic left  posterior PCA territory infarction. Multiple segments of mild stenosis and right PCA. Venous sinuses: As permitted by contrast timing, patent. Anatomic variants: Patent circle-of-Willis. Review of the MIP images confirms the above findings CT Brain Perfusion Findings: CBF (<30%) Volume: 54mL Perfusion (Tmax>6.0s) volume: 9mL Mismatch Volume: 51mL Infarction Location:Negative. IMPRESSION: 1. Patent carotid and vertebral arteries. No dissection, aneurysm, or hemodynamically significant stenosis utilizing NASCET criteria. 2. Patent anterior and posterior intracranial circulation. No large vessel occlusion, aneurysm, or significant stenosis. 3. Negative CT perfusion head. These results were called by telephone at the time of interpretation on 02/11/2018 at 8:24 pm to Dr. Amie Portland , who verbally acknowledged these results. Electronically Signed   By: Kristine Garbe M.D.   On: 02/11/2018 20:27   Ct Head Code Stroke Wo Contrast  Result Date: 02/11/2018 CLINICAL DATA:  Code stroke.  84 y/o  F; aphasia, no focal weakness. EXAM: CT HEAD WITHOUT CONTRAST TECHNIQUE: Contiguous axial images were obtained from the base of the skull through the vertex without intravenous contrast. COMPARISON:  06/25/2017 CT head FINDINGS: Brain: Left lateral temporal lobe and temporal operculum region of hypoattenuation likely representing late acute to subacute infarction. No hemorrhage or mass effect identified. Stable chronic infarcts in right parietal as well as bilateral occipital lobes and the right inferior cerebellar hemisphere. Stable chronic microvascular ischemic changes and parenchymal volume loss of the brain. Vascular: Calcific  atherosclerosis of carotid siphons. No hyperdense vessel identified. Skull: Normal. Negative for fracture or focal lesion. Sinuses/Orbits: No acute finding. Other: None. ASPECTS [facility] Stroke Program Early CT Score) - Ganglionic level infarction (caudate, lentiform nuclei, internal capsule,  insula, M1-M3 cortex): 5 - Supraganglionic infarction (M4-M6 cortex): 3 Total score (0-10 with 10 being normal): IMPRESSION: 1. Left lateral temporal lobe region of hypoattenuation likely representing late acute to subacute infarction. No hemorrhage or mass effect. 2. ASPECTS is 8 3. Multiple stable chronic infarctions, chronic microvascular ischemic changes, and parenchymal volume loss of the brain are otherwise stable. These results were communicated to Dr. Rory Percy at 8:05 pmon 3/5/2019by text page via the [facility] messaging system. Electronically Signed   By: Kristine Garbe M.D.   On: 02/11/2018 20:05     EKG: Independently reviewed.  Sinus rhythm, QTC 491, low voltage, early R-wave progression.  Assessment/Plan Principal Problem:   Ischemic stroke ([place]) Active Problems:   HLD (hyperlipidemia)   Essential hypertension   Dementia   Depression   CKD (chronic kidney disease), stage III (HCC)   Ischemic stroke ([place]): both CT-head and MRI of brain showed subacute left lateral temporal lobe infarction. CTA and brain perfusion study did no showed large vessel blockage. Dr. Rory Percy was consulted.   -will admit to tele bed as inpt -highly appreciated Dr. Rory Percy consultation and will f/u his recommendations as follows:   Recommendations by Dr. Rory Percy  -Admit to hospitalist  -Telemetry monitoring  -Allow for permissive hypertension for the first 24-48h - only treat PRN if SBP >220 mmHg. Blood pressures can be gradually normalized to SBP<140 upon discharge.  -Echocardiogram  -HgbA1c, fasting lipid panel  -Frequent neuro checks  -Prophylactic therapy-Antiplatelet med:  Plavix 75 mg PO daily for now. Decision to change antiplatelet or start anticoag per stroke team upon availability of more results as well as clinical course.  -Atorvastatin 80 mg PO daily  -Risk factor modification  -PT consult, OT consult, Speech consult  -If Afib found on telemetry, will need anticoagulation.  Decision pending imaging and stroke team rounding.  HLD: -lipitor  Essential hypertension: -hold [place] to allow permissive hypertension -IV hydralazine when necessary for SBP> 220.  Depression: no suicidal or homicidal ideations. -Continue home medications: Abilify, BuSpar, Zoloft  CKD (chronic kidney disease), stage III ([place]): stable. Baseline creatinine 1.0-1.3, her creatinine is 1.20, BUN 28. -Follow up renal function by BMP    DVT ppx: SQ Lovenox Code Status: Full code Family Communication: None at bed side.   Disposition Plan:  Anticipate discharge back to previous SNF Consults called:  Neuro, Dr. Rory Percy Admission status:  Inpatient/tele     Date of Service 02/11/2018    Ivor Costa [facility] Pager [phone]  If 7PM-7AM, please contact night-coverage [email or web address] Password [facility] 02/11/2018, 10:17 PM

## 2018-02-11 NOTE — Consult Note (Addendum)
Neurology Consultation  Reason for Consult: Acute code stroke Referring Physician: Dr. Maryan Rued  CC: Acute onset of aphasia  History is obtained from: EMT, chart  HPI: Nancy Rowland is a 84 y.o. female who has a past medical history of depression, anxiety, chronic pain, hypertension, hyperlipidemia who is a resident at a nursing facility and reportedly was normal when she was taken back to her room after dinner at 5:20 PM on 02/11/2018.  Her daughter came by and noticed that she was able to follow commands but not able to talk normally.  Because of her history of prior strokes as well as these new symptoms, EMS was called.  EMS noted that she had severe expressive aphasia with no focal weakness.  She was brought into the emergency room as an acute code stroke because of the last seen normal being 5:20 PM. Upon initial evaluation on the emergency room bridge, the patient had no focal motor or sensory signs but had expressive aphasia.  She was not mute but had a difficult time naming pictures on the NIH score card as well as with repetition. She was not able to reliably provide any history because of her aphasia.   LKW: Reported 5:20 PM on 02/11/2018 tpa given?: no, CT scan suggestive of established hypodensity in the left temporal region which is new from CT scan of 2018 and appears subacute in nature. Premorbid modified Rankin scale (mRS): At least 3  ROS: Unable to obtain due to patient's aphasia  Past Medical History:  Diagnosis Date  . Cancer ([place])   . Stroke [facility]) 08/2014    No family history on file. Patient unable to provide due to aphasia  Social History:   reports that  has never smoked. she has never used smokeless tobacco. Her alcohol and drug histories are not on file. Patient unable to provide due to aphasia  Medications List from the facility includes losartan 12.5 daily : Melatonin 3 mg at bedtime Senna Sertraline 100 mg every morning Oxycodone/acetaminophen 5-3 20  5/2 tablet by mouth every 6 hours as needed for pain Alendronate 70 mg each week Aripiprazole 2 mg daily Atorvastatin 10 mg at bedtime Buspirone 30 mg daily Calcium Plavix 75 daily. No current facility-administered medications for this encounter.   Current Outpatient Medications:  .  alendronate (FOSAMAX) 70 MG tablet, Take 70 mg by mouth once a week. Take with a full glass of water on an empty stomach., Disp: , Rfl:  .  ARIPiprazole (ABILIFY) 2 MG tablet, Take 1 mg by mouth daily., Disp: , Rfl:  .  atorvastatin (LIPITOR) 10 MG tablet, Take 10 mg by mouth daily., Disp: , Rfl:  .  busPIRone (BUSPAR) 15 MG tablet, Take 15 mg by mouth 3 (three) times daily. , Disp: , Rfl:  .  Calcium Carbonate-Vitamin D3 (CALCIUM 600-D) 600-400 MG-UNIT TABS, Take 1 tablet by mouth 2 (two) times daily., Disp: , Rfl:  .  calcium-vitamin D (OSCAL WITH D) 500-200 MG-UNIT tablet, Take 1 tablet by mouth 2 (two) times daily., Disp: 30 tablet, Rfl: 0 .  clopidogrel (PLAVIX) 75 MG tablet, Take 1 tablet (75 mg total) by mouth daily., Disp: 30 tablet, Rfl: 3 .  losartan (COZAAR) 25 MG tablet, Take 12.5 mg by mouth daily. , Disp: , Rfl:  .  Melatonin 3 MG TABS, Take 3 mg by mouth at bedtime., Disp: , Rfl:  .  oxyCODONE-acetaminophen (PERCOCET/ROXICET) 5-325 MG tablet, Take 0.5 tablets by mouth every 6 (six) hours as needed for  severe pain., Disp: 10 tablet, Rfl: 0 .  senna-docusate (SENOKOT-S) 8.6-50 MG tablet, Take 1 tablet by mouth at bedtime., Disp: 30 tablet, Rfl: 0 .  sertraline (ZOLOFT) 50 MG tablet, Take 75 mg by mouth daily., Disp: , Rfl:   Exam: Current vital signs: There were no vitals taken for this visit. Vital signs in last 24 hours:    HR 80 BP: 148/80 RR 20/m GENERAL: Awake, alert in NAD HEENT: - Normocephalic and atraumatic, dry mm, no LN++, no Thyromegally LUNGS - Clear to auscultation bilaterally with no wheezes CV - S1S2 RRR, no m/r/g, equal pulses bilaterally. ABDOMEN - Soft, nontender,  nondistended with normoactive BS Ext: warm, well perfused, intact peripheral pulses  NEURO:  Mental Status: awake alert, perseverating her name. Language: speech is mildly dysarthric.  Comprehension intact.  Naming and repetition are impaired Cranial Nerves: PERRL. EOMI, visual fields full, no facial asymmetry, facial sensation intact, hearing reduced significantly bilaterally, tongue/uvula/soft palate midline, normal sternocleidomastoid and trapezius muscle strength. No evidence of tongue atrophy or fibrillations Motor: resting tremor in right arm and hand. No focal weakness and no vertical drift. Tone: is mildly increased on right Sensation- Intact to light touch bilaterally Coordination: FTN intact bilaterally Gait- deferred  NIHSS 1a Level of Conscious.: 0 1b LOC Questions: 2 1c LOC Commands: 0 2 Best Gaze: 0 3 Visual: 0 4 Facial Palsy: 0 5a Motor Arm - left: 0 5b Motor Arm - Right: 0 6a Motor Leg - Left: 0 6b Motor Leg - Right: 0 7 Limb Ataxia: 0 8 Sensory: 0 9 Best Language: 2 10 Dysarthria: 1 11 Extinct. and Inatten.: 0 TOTAL: 5  Labs I have reviewed labs in epic and the results pertinent to this consultation are:  CBC    Component Value Date/Time   WBC 7.9 02/11/2018 1951   RBC 3.81 (L) 02/11/2018 1951   HGB 10.9 (L) 02/11/2018 1956   HCT 32.0 (L) 02/11/2018 1956   PLT 176 02/11/2018 1951   MCV 94.2 02/11/2018 1951   MCH 29.7 02/11/2018 1951   MCHC 31.5 02/11/2018 1951   RDW 14.8 02/11/2018 1951   LYMPHSABS 2.0 02/11/2018 1951   MONOABS 0.7 02/11/2018 1951   EOSABS 0.3 02/11/2018 1951   BASOSABS 0.0 02/11/2018 1951    CMP     Component Value Date/Time   NA 143 02/11/2018 1956   K 4.1 02/11/2018 1956   CL 112 (H) 02/11/2018 1956   CO2 22 06/26/2017 0550   GLUCOSE 172 (H) 02/11/2018 1956   BUN 28 (H) 02/11/2018 1956   CREATININE 1.20 (H) 02/11/2018 1956   CALCIUM 9.3 06/26/2017 0550   PROT 7.1 06/25/2017 1755   ALBUMIN 4.2 06/25/2017 1755   AST  21 06/25/2017 1755   ALT 18 06/25/2017 1755   ALKPHOS 74 06/25/2017 1755   BILITOT 0.5 06/25/2017 1755   GFRNONAA 36 (L) 06/26/2017 0550   GFRAA 42 (L) 06/26/2017 0550    Imaging I have reviewed the images obtained:  CT-scan of the brain - aspects 8. Left temporal hypodensity new from 2018. Appears subacute. CTA H+N: No LVO. No RAPID abnormality - possibly suggesting the hypodenisty is subacute.  MRI examination of the brain - limited DWI/FLAIR/GRE: restricted diffusion in left temporal area corresponding to the CT  Assessment:  88/F with old strokes, mRS 3, HTN HLD, p/w expressive aphasia. NIHSS on arrival 5 for expressive aphasia related symptoms. CTH shows established hypodensity in left temporal area that appears subacute. CTA does not show LVO. CTP  shows no abnormality - again supporting subacute timing of stroke. Took the patient for stat limited MRI of the brain which showed restricted diffusion in the left temporal area and a corresponding FLAIR abnormality. Not candidte for tPA due to established hypodensity. Not a candidate for EVT due to no LVO. Needs stroke w/u. In the past has had TEE done and 30d cardiac monitor with no Afib per family. Might need loop recorder. Already tried DAPT followed by Plavix. Might need anticoagulation. Decision per stroke team.  Impression: Acute ischemic stroke - left temporal area Stigmata of old strokes in b/l hemispheres in anterior and posterior circulations ICAD Eval for underlying cardiac rhythm abnormalities  Recommendations: -Admit to hospitalist -Telemetry monitoring -Allow for permissive hypertension for the first 24-48h - only treat PRN if SBP >220 mmHg. Blood pressures can be gradually normalized to SBP<140 upon discharge. -Echocardiogram -HgbA1c, fasting lipid panel -Frequent neuro checks -Prophylactic therapy-Antiplatelet med:  Plavix 75 mg PO daily for now. Decision to change antiplatelet or start anticoag per stroke  team upon availability of more results as well as clinical course. -Atorvastatin 80 mg PO daily -Risk factor modification -PT consult, OT consult, Speech consult -If Afib found on telemetry, will need anticoagulation. Decision pending imaging and stroke team rounding.  Please page stroke NP/PA/MD (listed on AMION)  from 8am-4 pm as this patient will be followed by the stroke team at this point.  -- Amie Portland, MD [facility] Pager: [phone] If 7pm to 7am, please call on call as listed on AMION.  CRITICAL CARE ATTESTATION This patient is critically ill and at significant risk of neurological worsening, death and care requires constant monitoring of vital signs, hemodynamics,respiratory and cardiac monitoring. I spent 60  minutes of neurocritical care time performing neurological assessment, discussion with family, other specialists and medical decision making of high complexityin the care of  this patient.

## 2018-02-11 NOTE — ED Triage Notes (Signed)
  Pt coming from SNF. Daughter visited and noticed patient had speech difficulties at 49.

## 2018-02-11 NOTE — Code Documentation (Signed)
84 yo Female with new onset aphasia from [facility] facility via [facility] as code stroke.  Dr. Rory Percy at bedside.On arrival, pt presented alert with expressive aphasia, dysarthria and tremor to right arm.  No focal weakness or drift. NIHSS 5. CT/CTA/CTP done.  MRI done.  No TPA.  Per Dr. Rory Percy, Left temporal hypodensity is subacute and no acute intervention at this time.  Primary care to ED RN Candance and pt to be admitted to Hospitalist team for stroke workup.  Family at bedside and Dr. Rory Percy providing update and POC.

## 2018-02-11 NOTE — ED Provider Notes (Signed)
[place] EMERGENCY DEPARTMENT Provider Note   CSN: [id number] Arrival date & time: 02/11/18  1951     History   Chief Complaint No chief complaint on file.   HPI Nancy Rowland is a 84 y.o. female.  Patient is an 84 year old female with a history of dementia, ischemic stroke, hyperlipidemia, hypertension presenting today as a code stroke by EMS.  Patient's last seen normal was 1720.  911 was called because patient was found to be aphasic.  When EMS arrived they do note that patient is having aphasia but no other focal deficits.  Patient does take Plavix.  No medication changes except for a dosage change of her antidepressant.  She has had no infectious symptoms   The history is provided by the EMS personnel.    Past Medical History:  Diagnosis Date  . Cancer ([place])   . Stroke [facility]) 08/2014    Patient Active Problem List   Diagnosis Date Noted  . Nondisplaced comminuted fracture of left patella, initial encounter for closed fracture 07/15/2017  . Pyuria 06/26/2017  . Dementia 06/25/2017  . Fall 06/25/2017  . Closed fracture of left distal radius 05/27/2017  . Ischemic stroke ([place])   . History of stroke   . HLD (hyperlipidemia)   . Essential hypertension   . TIA (transient ischemic attack) 11/02/2015    Past Surgical History:  Procedure Laterality Date  . ABDOMINAL HYSTERECTOMY    . TEE WITHOUT CARDIOVERSION N/A 11/04/2015   Procedure: TRANSESOPHAGEAL ECHOCARDIOGRAM (TEE);  Surgeon: Skeet Latch, MD;  Location: [facility] ENDOSCOPY;  Service: Cardiovascular;  Laterality: N/A;    OB History    No data available       Home Medications    Prior to Admission medications   Medication Sig Start Date End Date Taking? Authorizing Provider  alendronate (FOSAMAX) 70 MG tablet Take 70 mg by mouth once a week. Take with a full glass of water on an empty stomach.    [provider]  ARIPiprazole (ABILIFY) 2 MG tablet Take 1 mg by mouth daily.     [provider]  atorvastatin (LIPITOR) 10 MG tablet Take 10 mg by mouth daily.    [provider]  busPIRone (BUSPAR) 15 MG tablet Take 15 mg by mouth 3 (three) times daily.     [provider]  Calcium Carbonate-Vitamin D3 (CALCIUM 600-D) 600-400 MG-UNIT TABS Take 1 tablet by mouth 2 (two) times daily.    [provider]  calcium-vitamin D (OSCAL WITH D) 500-200 MG-UNIT tablet Take 1 tablet by mouth 2 (two) times daily. 06/26/17   Raiford Noble Latif, DO  clopidogrel (PLAVIX) 75 MG tablet Take 1 tablet (75 mg total) by mouth daily. 11/04/15   Dellia Nims, MD  losartan (COZAAR) 25 MG tablet Take 12.5 mg by mouth daily.     [provider]  Melatonin 3 MG TABS Take 3 mg by mouth at bedtime.    [provider]  oxyCODONE-acetaminophen (PERCOCET/ROXICET) 5-325 MG tablet Take 0.5 tablets by mouth every 6 (six) hours as needed for severe pain. 06/26/17   Sheikh, Omair Latif, DO  senna-docusate (SENOKOT-S) 8.6-50 MG tablet Take 1 tablet by mouth at bedtime. 11/04/15   Dellia Nims, MD  sertraline (ZOLOFT) 50 MG tablet Take 75 mg by mouth daily.    [provider]    Family History No family history on file.  Social History Social History   Tobacco Use  . Smoking status: Never Smoker  .  Smokeless tobacco: Never Used  Substance Use Topics  . Alcohol use: Not on file  . Drug use: Not on file     Allergies   Patient has no known allergies.   Review of Systems Review of Systems  All other systems reviewed and are negative.    Physical Exam Updated Vital Signs There were no vitals taken for this visit.  Physical Exam  Constitutional: She appears well-developed and well-nourished. No distress.  Appears visibly upset but is able to follow commands  HENT:  Head: Normocephalic and atraumatic.  Mouth/Throat: Oropharynx is clear and moist.  Eyes: Conjunctivae and EOM are normal. Pupils are equal, round, and reactive to  light.  Neck: Normal range of motion. Neck supple.  Cardiovascular: Normal rate, regular rhythm and intact distal pulses.  No murmur heard. Pulmonary/Chest: Effort normal and breath sounds normal. No respiratory distress. She has no wheezes. She has no rales.  Abdominal: Soft. She exhibits no distension. There is no tenderness. There is no rebound and no guarding.  Musculoskeletal: Normal range of motion. She exhibits no edema or tenderness.  Neurological: She is alert. She has normal strength. No cranial nerve deficit or sensory deficit. Coordination normal.  Expressive aphasia.  No visual field cuts.  No pronator drift of bilateral upper or lower extremities.  5 out of 5 strength in bilateral upper and lower extremities.  Sensation is intact.  No neglect.  Mild tremor of the right hand.  Skin: Skin is warm and dry. No rash noted. No erythema.  Psychiatric: She has a normal mood and affect. Her behavior is normal.  Nursing note and vitals reviewed.    ED Treatments / Results  Labs (all labs ordered are listed, but only abnormal results are displayed) Labs Reviewed  CBC - Abnormal; Notable for the following components:      Result Value   RBC 3.81 (*)    Hemoglobin 11.3 (*)    HCT 35.9 (*)    All other components within normal limits  I-STAT CHEM 8, ED - Abnormal; Notable for the following components:   Chloride 112 (*)    BUN 28 (*)    Creatinine, Ser 1.20 (*)    Glucose, Bld 172 (*)    Calcium, Ion 1.12 (*)    TCO2 20 (*)    Hemoglobin 10.9 (*)    HCT 32.0 (*)    All other components within normal limits  PROTIME-INR  APTT  DIFFERENTIAL  ETHANOL  COMPREHENSIVE METABOLIC PANEL  RAPID URINE DRUG SCREEN, HOSP PERFORMED  URINALYSIS, ROUTINE W REFLEX MICROSCOPIC  I-STAT TROPONIN, ED    EKG  EKG Interpretation None       Radiology Ct Angio Head W Or Wo Contrast  Result Date: 02/11/2018 CLINICAL DATA:  84 y/o  F; aphasia, no focal weakness. EXAM: CT ANGIOGRAPHY HEAD  AND NECK CT PERFUSION BRAIN TECHNIQUE: Multidetector CT imaging of the head and neck was performed using the standard protocol during bolus administration of intravenous contrast. Multiplanar CT image reconstructions and MIPs were obtained to evaluate the vascular anatomy. Carotid stenosis measurements (when applicable) are obtained utilizing NASCET criteria, using the distal internal carotid diameter as the denominator. Multiphase CT imaging of the brain was performed following IV bolus contrast injection. Subsequent parametric perfusion maps were calculated using RAPID software. CONTRAST:  86mL ISOVUE-370 IOPAMIDOL (ISOVUE-370) INJECTION 76% COMPARISON:  02/11/2018 CT head. FINDINGS: CTA NECK FINDINGS Aortic arch: Standard branching. Imaged portion shows no evidence of aneurysm or dissection. No significant stenosis  of the major arch vessel origins. Mild calcific atherosclerosis. Right carotid system: No evidence of dissection, stenosis (50% or greater) or occlusion. Left carotid system: No evidence of dissection, stenosis (50% or greater) or occlusion. Vertebral arteries: Left dominant. No evidence of dissection, stenosis (50% or greater) or occlusion. Skeleton: Moderate cervical spondylosis with multilevel disc and facet degenerative changes. Canal stenosis is greatest at the C4 through C6 levels where it is at least moderate. Other neck: Negative. Upper chest: Mild biapical pleuroparenchymal scarring. Review of the MIP images confirms the above findings CTA HEAD FINDINGS Anterior circulation: No significant stenosis, proximal occlusion, aneurysm, or vascular malformation. Non stenotic calcific atherosclerosis of carotid siphons. Posterior circulation: No significant stenosis, proximal occlusion, aneurysm, or vascular malformation. Diminutive left PCA with chronic left posterior PCA territory infarction. Multiple segments of mild stenosis and right PCA. Venous sinuses: As permitted by contrast timing, patent.  Anatomic variants: Patent circle-of-Willis. Review of the MIP images confirms the above findings CT Brain Perfusion Findings: CBF (<30%) Volume: 38mL Perfusion (Tmax>6.0s) volume: 52mL Mismatch Volume: 50mL Infarction Location:Negative. IMPRESSION: 1. Patent carotid and vertebral arteries. No dissection, aneurysm, or hemodynamically significant stenosis utilizing NASCET criteria. 2. Patent anterior and posterior intracranial circulation. No large vessel occlusion, aneurysm, or significant stenosis. 3. Negative CT perfusion head. These results were called by telephone at the time of interpretation on 02/11/2018 at 8:24 pm to Dr. Amie Portland , who verbally acknowledged these results. Electronically Signed   By: Kristine Garbe M.D.   On: 02/11/2018 20:27   Ct Angio Neck W Or Wo Contrast  Result Date: 02/11/2018 CLINICAL DATA:  84 y/o  F; aphasia, no focal weakness. EXAM: CT ANGIOGRAPHY HEAD AND NECK CT PERFUSION BRAIN TECHNIQUE: Multidetector CT imaging of the head and neck was performed using the standard protocol during bolus administration of intravenous contrast. Multiplanar CT image reconstructions and MIPs were obtained to evaluate the vascular anatomy. Carotid stenosis measurements (when applicable) are obtained utilizing NASCET criteria, using the distal internal carotid diameter as the denominator. Multiphase CT imaging of the brain was performed following IV bolus contrast injection. Subsequent parametric perfusion maps were calculated using RAPID software. CONTRAST:  33mL ISOVUE-370 IOPAMIDOL (ISOVUE-370) INJECTION 76% COMPARISON:  02/11/2018 CT head. FINDINGS: CTA NECK FINDINGS Aortic arch: Standard branching. Imaged portion shows no evidence of aneurysm or dissection. No significant stenosis of the major arch vessel origins. Mild calcific atherosclerosis. Right carotid system: No evidence of dissection, stenosis (50% or greater) or occlusion. Left carotid system: No evidence of dissection,  stenosis (50% or greater) or occlusion. Vertebral arteries: Left dominant. No evidence of dissection, stenosis (50% or greater) or occlusion. Skeleton: Moderate cervical spondylosis with multilevel disc and facet degenerative changes. Canal stenosis is greatest at the C4 through C6 levels where it is at least moderate. Other neck: Negative. Upper chest: Mild biapical pleuroparenchymal scarring. Review of the MIP images confirms the above findings CTA HEAD FINDINGS Anterior circulation: No significant stenosis, proximal occlusion, aneurysm, or vascular malformation. Non stenotic calcific atherosclerosis of carotid siphons. Posterior circulation: No significant stenosis, proximal occlusion, aneurysm, or vascular malformation. Diminutive left PCA with chronic left posterior PCA territory infarction. Multiple segments of mild stenosis and right PCA. Venous sinuses: As permitted by contrast timing, patent. Anatomic variants: Patent circle-of-Willis. Review of the MIP images confirms the above findings CT Brain Perfusion Findings: CBF (<30%) Volume: 19mL Perfusion (Tmax>6.0s) volume: 70mL Mismatch Volume: 21mL Infarction Location:Negative. IMPRESSION: 1. Patent carotid and vertebral arteries. No dissection, aneurysm, or hemodynamically significant stenosis utilizing NASCET  criteria. 2. Patent anterior and posterior intracranial circulation. No large vessel occlusion, aneurysm, or significant stenosis. 3. Negative CT perfusion head. These results were called by telephone at the time of interpretation on 02/11/2018 at 8:24 pm to Dr. Amie Portland , who verbally acknowledged these results. Electronically Signed   By: Kristine Garbe M.D.   On: 02/11/2018 20:27   Ct Cerebral Perfusion W Contrast  Result Date: 02/11/2018 CLINICAL DATA:  84 y/o  F; aphasia, no focal weakness. EXAM: CT ANGIOGRAPHY HEAD AND NECK CT PERFUSION BRAIN TECHNIQUE: Multidetector CT imaging of the head and neck was performed using the standard  protocol during bolus administration of intravenous contrast. Multiplanar CT image reconstructions and MIPs were obtained to evaluate the vascular anatomy. Carotid stenosis measurements (when applicable) are obtained utilizing NASCET criteria, using the distal internal carotid diameter as the denominator. Multiphase CT imaging of the brain was performed following IV bolus contrast injection. Subsequent parametric perfusion maps were calculated using RAPID software. CONTRAST:  49mL ISOVUE-370 IOPAMIDOL (ISOVUE-370) INJECTION 76% COMPARISON:  02/11/2018 CT head. FINDINGS: CTA NECK FINDINGS Aortic arch: Standard branching. Imaged portion shows no evidence of aneurysm or dissection. No significant stenosis of the major arch vessel origins. Mild calcific atherosclerosis. Right carotid system: No evidence of dissection, stenosis (50% or greater) or occlusion. Left carotid system: No evidence of dissection, stenosis (50% or greater) or occlusion. Vertebral arteries: Left dominant. No evidence of dissection, stenosis (50% or greater) or occlusion. Skeleton: Moderate cervical spondylosis with multilevel disc and facet degenerative changes. Canal stenosis is greatest at the C4 through C6 levels where it is at least moderate. Other neck: Negative. Upper chest: Mild biapical pleuroparenchymal scarring. Review of the MIP images confirms the above findings CTA HEAD FINDINGS Anterior circulation: No significant stenosis, proximal occlusion, aneurysm, or vascular malformation. Non stenotic calcific atherosclerosis of carotid siphons. Posterior circulation: No significant stenosis, proximal occlusion, aneurysm, or vascular malformation. Diminutive left PCA with chronic left posterior PCA territory infarction. Multiple segments of mild stenosis and right PCA. Venous sinuses: As permitted by contrast timing, patent. Anatomic variants: Patent circle-of-Willis. Review of the MIP images confirms the above findings CT Brain Perfusion  Findings: CBF (<30%) Volume: 52mL Perfusion (Tmax>6.0s) volume: 76mL Mismatch Volume: 56mL Infarction Location:Negative. IMPRESSION: 1. Patent carotid and vertebral arteries. No dissection, aneurysm, or hemodynamically significant stenosis utilizing NASCET criteria. 2. Patent anterior and posterior intracranial circulation. No large vessel occlusion, aneurysm, or significant stenosis. 3. Negative CT perfusion head. These results were called by telephone at the time of interpretation on 02/11/2018 at 8:24 pm to Dr. Amie Portland , who verbally acknowledged these results. Electronically Signed   By: Kristine Garbe M.D.   On: 02/11/2018 20:27   Ct Head Code Stroke Wo Contrast  Result Date: 02/11/2018 CLINICAL DATA:  Code stroke.  84 y/o  F; aphasia, no focal weakness. EXAM: CT HEAD WITHOUT CONTRAST TECHNIQUE: Contiguous axial images were obtained from the base of the skull through the vertex without intravenous contrast. COMPARISON:  06/25/2017 CT head FINDINGS: Brain: Left lateral temporal lobe and temporal operculum region of hypoattenuation likely representing late acute to subacute infarction. No hemorrhage or mass effect identified. Stable chronic infarcts in right parietal as well as bilateral occipital lobes and the right inferior cerebellar hemisphere. Stable chronic microvascular ischemic changes and parenchymal volume loss of the brain. Vascular: Calcific atherosclerosis of carotid siphons. No hyperdense vessel identified. Skull: Normal. Negative for fracture or focal lesion. Sinuses/Orbits: No acute finding. Other: None. ASPECTS [facility] Stroke Program Early CT  Score) - Ganglionic level infarction (caudate, lentiform nuclei, internal capsule, insula, M1-M3 cortex): 5 - Supraganglionic infarction (M4-M6 cortex): 3 Total score (0-10 with 10 being normal): IMPRESSION: 1. Left lateral temporal lobe region of hypoattenuation likely representing late acute to subacute infarction. No hemorrhage or mass  effect. 2. ASPECTS is 8 3. Multiple stable chronic infarctions, chronic microvascular ischemic changes, and parenchymal volume loss of the brain are otherwise stable. These results were communicated to Dr. Rory Percy at 8:05 pmon 3/5/2019by text page via the [facility] messaging system. Electronically Signed   By: Kristine Garbe M.D.   On: 02/11/2018 20:05    Procedures Procedures (including critical care time)  Medications Ordered in ED Medications - No data to display   Initial Impression / Assessment and Plan / ED Course  I have reviewed the triage vital signs and the nursing notes.  Pertinent labs & imaging results that were available during my care of the patient were reviewed by me and considered in my medical decision making (see chart for details).     Elderly female with prior history of stroke presenting today with expressive aphasia.  Patient is in the window and a code stroke was initiated.  Last seen normal was 1720 which was approximately 2-1/2 hours prior to arrival.  On exam patient is having expressive a fascia but the rest of exam is within normal limits.  She is in no acute distress and is protecting her airway.  Stroke team is at bedside and patient went directly to CAT scan.  Patient does take Plavix.  8:34 PM Labs at baseline and CT showing left lateral temporal lobe region of hypoattenuation likely representing a late acute to subacute infarct.  No hemorrhage or mass-effect.  Also multiple stable chronic infarcts.  Patient CTA is negative for acute findings.   Based on imaging pt's stroke has been going on for longer than specified time and she is not a TPA candidate.  Will admit to hospitalist.  Final Clinical Impressions(s) / ED Diagnoses   Final diagnoses:  Cerebrovascular accident (CVA), unspecified mechanism [facility])    ED Discharge Orders    None       Blanchie Dessert, MD 02/11/18 2113

## 2018-02-11 NOTE — ED Notes (Signed)
Hospitalist at bedside 

## 2018-02-11 NOTE — ED Notes (Signed)
Pt's CBG result was 162. Informed Candace - RN.

## 2018-02-11 NOTE — ED Notes (Signed)
Pt has GCS 14. Has hx of dementia baseline. Pupils equal and reactive to light. Skin warm and dry. Respirations equal and unlabored. S1 and s2 heart sonds audible. NSR to cardiac monitor. Abdomen soft and non distended. VSS. Cardiac moniter placed on patient. Family at bedside.

## 2018-02-12 ENCOUNTER — Inpatient Hospital Stay (HOSPITAL_COMMUNITY): Payer: Medicare Other

## 2018-02-12 ENCOUNTER — Other Ambulatory Visit: Payer: Self-pay

## 2018-02-12 DIAGNOSIS — I10 Essential (primary) hypertension: Secondary | ICD-10-CM

## 2018-02-12 DIAGNOSIS — G309 Alzheimer's disease, unspecified: Secondary | ICD-10-CM

## 2018-02-12 DIAGNOSIS — I639 Cerebral infarction, unspecified: Principal | ICD-10-CM

## 2018-02-12 DIAGNOSIS — F0280 Dementia in other diseases classified elsewhere without behavioral disturbance: Secondary | ICD-10-CM

## 2018-02-12 LAB — URINALYSIS, ROUTINE W REFLEX MICROSCOPIC
BILIRUBIN URINE: NEGATIVE
Glucose, UA: NEGATIVE mg/dL
Hgb urine dipstick: NEGATIVE
KETONES UR: NEGATIVE mg/dL
Nitrite: NEGATIVE
Protein, ur: NEGATIVE mg/dL
SPECIFIC GRAVITY, URINE: 1.04 — AB (ref 1.005–1.030)
pH: 5 (ref 5.0–8.0)

## 2018-02-12 LAB — LIPID PANEL
CHOLESTEROL: 154 mg/dL (ref 0–200)
HDL: 48 mg/dL (ref >40)
LDL Cholesterol: 79 mg/dL (ref 0–99)
Total CHOL/HDL Ratio: 3.2 RATIO
Triglycerides: 136 mg/dL (ref <150)
VLDL: 27 mg/dL (ref 0–40)

## 2018-02-12 LAB — GLUCOSE, CAPILLARY: Glucose-Capillary: 162 mg/dL — ABNORMAL HIGH (ref 65–99)

## 2018-02-12 LAB — ECHOCARDIOGRAM COMPLETE
Height: 66 in
Weight: 2719.59 oz

## 2018-02-12 LAB — HEMOGLOBIN A1C
Hgb A1c MFr Bld: 5.8 % — ABNORMAL HIGH (ref 4.8–5.6)
MEAN PLASMA GLUCOSE: 119.76 mg/dL

## 2018-02-12 LAB — RAPID URINE DRUG SCREEN, HOSP PERFORMED
Amphetamines: NOT DETECTED
BENZODIAZEPINES: NOT DETECTED
Barbiturates: NOT DETECTED
COCAINE: NOT DETECTED
Opiates: NOT DETECTED
TETRAHYDROCANNABINOL: NOT DETECTED

## 2018-02-12 LAB — MRSA PCR SCREENING: MRSA by PCR: NEGATIVE

## 2018-02-12 MED ORDER — ASPIRIN EC 81 MG PO TBEC
81.0000 mg | DELAYED_RELEASE_TABLET | Freq: Every day | ORAL | Status: DC
  Administered 2018-02-12 – 2018-02-14 (×3): 81 mg via ORAL
  Filled 2018-02-12 (×3): qty 1

## 2018-02-12 NOTE — Progress Notes (Signed)
Pt was brought up to the unit at about, 2254 on a bed, she is alert, but oriented to self only. Her vital signs are within normal limits.

## 2018-02-12 NOTE — Progress Notes (Signed)
  Echocardiogram 2D Echocardiogram has been performed.  Nancy Rowland 02/12/2018, 10:11 AM

## 2018-02-12 NOTE — Evaluation (Signed)
Physical Therapy Evaluation Patient Details Name: Nancy Rowland MRN: [id number] DOB: [birth year] Today's Date: 02/12/2018   History of Present Illness  84 y.o. female with medical history significant of stroke, hypertension, hyperlipidemia, depression, dementia, CK-3, who presents with aphasiaMRI Subacute left lateral temporal lobe infarction.  Clinical Impression  PTA pt was ambulating to dining hall 3x/day with Rollator and requires assist for ADLs from staff. Pt currently minA for transfers and min guard for ambulation of 100 feet with RW. If ALF able to provide 24/hr supervision and HHPT at discharge pt would benefit from the familiar environment. PT will continue to follow acutely until d/c.     Follow Up Recommendations Home health PT;Supervision/Assistance - 24 hour    Equipment Recommendations  None recommended by PT    Recommendations for Other Services       Precautions / Restrictions Precautions Precautions: Fall Restrictions Weight Bearing Restrictions: No      Mobility  Bed Mobility               General bed mobility comments: pt OOB in chair. Has adjustable bed at ALF  Transfers Overall transfer level: Needs assistance   Transfers: Sit to/from Stand Sit to Stand: Min assist         General transfer comment: minA for steadying with RW  Ambulation/Gait Ambulation/Gait assistance: Min guard Ambulation Distance (Feet): 100 Feet Assistive device: Rolling walker (2 wheeled) Gait Pattern/deviations: Step-through pattern;Decreased step length - right;Decreased step length - left;Shuffle Gait velocity: slowed Gait velocity interpretation: Below normal speed for age/gender General Gait Details: min guard for safety, pt obviously uses a rollator at baseline, as she was looking for brakes and did not follow commands to get closer to RW for safety, pt very tentative in her ambulation with very small steps, hugging close to the wall, pt commented on shiny floor  and socks, pt daughter reports that pt always walks with shoes and there is carpeting in her ALF      Balance Overall balance assessment: Needs assistance   Sitting balance-Leahy Scale: Fair       Standing balance-Leahy Scale: Fair                               Pertinent Vitals/Pain Pain Assessment: Faces Faces Pain Scale: Hurts a little bit Pain Location: 0 Pain Descriptors / Indicators: Discomfort Pain Intervention(s): Monitored during session    Home Living     Available Help at Discharge: [place] Type of Home: [place]                Prior Function Level of Independence: Needs assistance   Gait / Transfers Assistance Needed: used rollator to walk to dining room 3x/day  ADL's / Homemaking Assistance Needed: staff assisted with self care        Hand Dominance   Dominant Hand: Right    Extremity/Trunk Assessment   Upper Extremity Assessment Upper Extremity Assessment: Defer to OT evaluation    Lower Extremity Assessment Lower Extremity Assessment: Generalized weakness    Cervical / Trunk Assessment Cervical / Trunk Assessment: Kyphotic  Communication      Cognition Arousal/Alertness: Awake/alert Behavior During Therapy: Anxious Overall Cognitive Status: History of cognitive impairments - at baseline                                 General  Comments: Pt daughter reports baseline dementia      General Comments General comments (skin integrity, edema, etc.): Pt daughter present during session and provided valuable information about her living situation        Assessment/Plan    PT Assessment Patient needs continued PT services  PT Problem List Decreased activity tolerance;Decreased balance;Decreased mobility;Decreased cognition;Decreased safety awareness;Decreased knowledge of precautions       PT Treatment Interventions DME instruction;Gait training;Functional mobility  training;Therapeutic activities;Therapeutic exercise;Balance training;Cognitive remediation;Patient/family education;Neuromuscular re-education    PT Goals (Current goals can be found in the Care Plan section)  Acute Rehab PT Goals Patient Stated Goal: per daughter - to get better PT Goal Formulation: With patient/family Time For Goal Achievement: 02/26/18 Potential to Achieve Goals: Good    Frequency Min 3X/week    AM-PAC PT "6 Clicks" Daily Activity  Outcome Measure Difficulty turning over in bed (including adjusting bedclothes, sheets and blankets)?: A Little Difficulty moving from lying on back to sitting on the side of the bed? : Unable Difficulty sitting down on and standing up from a chair with arms (e.g., wheelchair, bedside commode, etc,.)?: Unable Help needed moving to and from a bed to chair (including a wheelchair)?: A Little Help needed walking in hospital room?: A Little Help needed climbing 3-5 steps with a railing? : A Lot 6 Click Score: 13    End of Session Equipment Utilized During Treatment: Gait belt Activity Tolerance: Patient limited by fatigue Patient left: in chair;with call bell/phone within reach;with chair alarm set;with family/visitor present Nurse Communication: Mobility status PT Visit Diagnosis: Other abnormalities of gait and mobility (R26.89);Muscle weakness (generalized) (M62.81);Difficulty in walking, not elsewhere classified (R26.2)    Time: 7342-8768 PT Time Calculation (min) (ACUTE ONLY): 29 min   Charges:   PT Evaluation $PT Eval Moderate Complexity: 1 Mod PT Treatments $Gait Training: 8-22 mins   PT G Codes:        Dorreen Valiente B. Migdalia Dk PT, DPT Acute Rehabilitation  [phone] Pager [phone]   Josephine 02/12/2018, 5:24 PM

## 2018-02-12 NOTE — Progress Notes (Signed)
Occupational Therapy Evaluation Patient Details Name: Nancy Rowland MRN: [id number] DOB: 1929/10/28 Today's Date: 02/12/2018    History of Present Illness 84 y.o. female with medical history significant of stroke, hypertension, hyperlipidemia, depression, dementia, CK-3, who presents with aphasiaMRI Subacute left lateral temporal lobe infarction.   Clinical Impression   PTA, pt lived at [facility]) and required assistance with bathing and dressing and toileting. Pt was able to ambulate to the dining room 3x/day. Pt currently able to ambulate to her sink and complette a grooming task then ambulate to the nurses station with minguard A @ RW level. Pt appears oriented to self only. Feel pt is appropriate to DC back to familiar environment of [place] if they are able to provide increased level of assistance initially upon return to facility. Will follow acutely to address established goals.     Follow Up Recommendations  Home health OT;Supervision/Assistance - 24 hour (initially)   Equipment Recommendations  None recommended by OT    Recommendations for Other Services       Precautions / Restrictions Precautions Precautions: Fall      Mobility Bed Mobility               General bed mobility comments: pt OOB in chair. Has adjustable bed at ALF  Transfers Overall transfer level: Needs assistance   Transfers: Sit to/from Stand Sit to Stand: Min assist              Balance Overall balance assessment: Needs assistance   Sitting balance-Leahy Scale: Fair       Standing balance-Leahy Scale: Fair                             ADL either performed or assessed with clinical judgement   ADL Overall ADL's : Needs assistance/impaired Eating/Feeding: Set up;Supervision/ safety;Sitting   Grooming: Minimal assistance;Standing Grooming Details (indicate cue type and reason): assist to open containers. Pt able to brush teeth adn stand at  sink unsupported Upper Body Bathing: Minimal assistance;Sitting   Lower Body Bathing: Moderate assistance;Sit to/from stand   Upper Body Dressing : Moderate assistance;Sitting   Lower Body Dressing: Moderate assistance;Sit to/from stand   Toilet Transfer: Minimal assistance;RW;Ambulation;BSC   Toileting- Clothing Manipulation and Hygiene: Moderate assistance Toileting - Clothing Manipulation Details (indicate cue type and reason): pt wears depends at baseline; Daughter brining them in      Functional mobility during ADLs: Minimal assistance;Rolling walker;Cueing for safety       Vision   Additional Comments: wioll further assess     Perception     Praxis      Pertinent Vitals/Pain Pain Assessment: Faces Faces Pain Scale: Hurts a little bit Pain Location: rubbing L knee (has hx of L kneecap fx per daughter) Pain Descriptors / Indicators: Discomfort Pain Intervention(s): Limited activity within patient's tolerance     Hand Dominance Right   Extremity/Trunk Assessment Upper Extremity Assessment Upper Extremity Assessment: Generalized weakness   Lower Extremity Assessment Lower Extremity Assessment: Defer to PT evaluation   Cervical / Trunk Assessment Cervical / Trunk Assessment: Kyphotic   Communication Communication Communication: HOH   Cognition Arousal/Alertness: Awake/alert Behavior During Therapy: Anxious Overall Cognitive Status: No family/caregiver present to determine baseline cognitive functioning                                 General Comments: has  dementia. Pt able to follow 1 step commands with increased time Decreased safety awareness   General Comments       Exercises     Shoulder Instructions      Home Living Family/patient expects to be discharged to:: Assisted living                             Home Equipment: Walker - 2 wheels;Walker - 4 wheels   Additional Comments: Spoke with daughter Di Kindle) over the  phone. States pt lived in [place] and staff assisted her with bathing and dressing and assisted her to the bathroom. Pt ambualted 3x/day to the dining room.       Prior Functioning/Environment Level of Independence: Needs assistance  Gait / Transfers Assistance Needed: used a Actuary - unsure if rollator or RW ADL's / Homemaking Assistance Needed: staff assisted with self care Communication / Swallowing Assistance Needed: some word finding deficits at baseline          OT Problem List: Decreased activity tolerance;Impaired balance (sitting and/or standing);Decreased cognition;Decreased safety awareness;Decreased knowledge of use of DME or AE      OT Treatment/Interventions: Self-care/ADL training;DME and/or AE instruction;Therapeutic activities;Cognitive remediation/compensation;Patient/family education;Balance training    OT Goals(Current goals can be found in the care plan section) Acute Rehab OT Goals Patient Stated Goal: per daughter - to get better OT Goal Formulation: With patient/family Time For Goal Achievement: 02/26/18 Potential to Achieve Goals: Good  OT Frequency: Min 2X/week   Barriers to D/C:            Co-evaluation              AM-PAC PT "6 Clicks" Daily Activity     Outcome Measure Help from another person eating meals?: A Little Help from another person taking care of personal grooming?: A Little Help from another person toileting, which includes using toliet, bedpan, or urinal?: A Little Help from another person bathing (including washing, rinsing, drying)?: A Little Help from another person to put on and taking off regular upper body clothing?: A Little Help from another person to put on and taking off regular lower body clothing?: A Lot 6 Click Score: 17   End of Session Equipment Utilized During Treatment: Gait belt;Rolling walker Nurse Communication: Mobility status;Other (comment)(position of IV sites limiting pt able to complete ADL)  Activity  Tolerance: Patient tolerated treatment well Patient left: in chair;with call bell/phone within reach;with chair alarm set  OT Visit Diagnosis: Unsteadiness on feet (R26.81);Other symptoms and signs involving cognitive function;Cognitive communication deficit (R41.841) Symptoms and signs involving cognitive functions: Cerebral infarction                Time: 1110-1144 OT Time Calculation (min): 34 min Charges:  OT General Charges $OT Visit: 1 Visit OT Evaluation $OT Eval Moderate Complexity: 1 Mod OT Treatments $Self Care/Home Management : 8-22 mins G-Codes:     [facility], OT/L  [phone] 02/12/2018  Lorah Kalina,HILLARY 02/12/2018, 11:44 AM

## 2018-02-12 NOTE — Progress Notes (Signed)
PROGRESS NOTE  Nancy Rowland IHK:[id number] DOB: October 12, 1929 DOA: 02/11/2018 PCP: System, Pcp Not In  HPI/Recap of past 24 hours: Nancy Rowland is a 84 y.o. female with medical history significant of stroke, hypertension, hyperlipidemia, depression, dementia, CK-3, who presents with aphasia.  Admitted for stroke workup.  MRI revealed left lateral temporal lobe infarction.  Neurology consulted and following.  02/12/2018: Patient seen and examined at bedside.  Denies headaches or change in her vision.  Also denies chest pain or palpitations    Assessment/Plan: Principal Problem:   Ischemic stroke ([place]) Active Problems:   HLD (hyperlipidemia)   Essential hypertension   Dementia   Depression   CKD (chronic kidney disease), stage III (HCC)  Acute CVA left lateral temporal lobe -Neurology following -Loop recorder recommended by neurology -Continue aspirin and Plavix per neuro -speech therapy recommended skilled nursing facility -PT recommended home health PT -OT recommended home health OT -LDL 79 goal 70 -A1c 5.8  Hypertension -Blood pressure stable -Continue meds  Hyperlipidemia -Continue statin -LDL goal less than 70  Depression -Continue home meds Abilify Zoloft BuSpar  CKD 3 -Baseline creatinine 1.2 -Creatinine of 1.20   Code Status: DNR  Family Communication: None at bedside  Disposition Plan: Skilled nursing facility   Consultants:  Neurology  Procedures:  None  Antimicrobials:  None  DVT prophylaxis: SCDs; subcu Lovenox   Objective: Vitals:   02/12/18 0100 02/12/18 0300 02/12/18 0500 02/12/18 0801  BP: (!) 132/52 (!) 121/59 119/65 (!) 134/57  Pulse:      Resp: 18 17 17 16   Temp: 97.6 F (36.4 C) (!) 97.3 F (36.3 C) 98.2 F (36.8 C) 97.9 F (36.6 C)  TempSrc: Oral Oral Oral Oral  SpO2: 98% 95% 96% 99%  Weight:      Height:        Intake/Output Summary (Last 24 hours) at 02/12/2018 0904 Last data filed at 02/12/2018 0600 Gross per 24  hour  Intake 578.75 ml  Output 0 ml  Net 578.75 ml   Filed Weights   02/11/18 2057  Weight: 77.1 kg (169 lb 15.6 oz)    Exam:   General: 84 year old female in no acute distress.  Has expressive aphasia  cardiovascular: Regular rate and rhythm no rubs or gallops  Respiratory: Mild rales at bases  Abdomen: Normal bowel sounds   Musculoskeletal: Normal strength   skin: No rash  Psychiatry: Mood is appropriate   Data Reviewed: CBC: Recent Labs  Lab 02/11/18 1951 02/11/18 1956  WBC 7.9  --   NEUTROABS 4.9  --   HGB 11.3* 10.9*  HCT 35.9* 32.0*  MCV 94.2  --   PLT 176  --    Basic Metabolic Panel: Recent Labs  Lab 02/11/18 1951 02/11/18 1956  NA 140 143  K 4.1 4.1  CL 112* 112*  CO2 19*  --   GLUCOSE 177* 172*  BUN 25* 28*  CREATININE 1.18* 1.20*  CALCIUM 8.6*  --    GFR: Estimated Creatinine Clearance: 35.3 mL/min (A) (by C-G formula based on SCr of 1.2 mg/dL (H)). Liver Function Tests: Recent Labs  Lab 02/11/18 1951  AST 21  ALT 17  ALKPHOS 77  BILITOT 0.4  PROT 6.2*  ALBUMIN 3.7   No results for input(s): LIPASE, AMYLASE in the last 168 hours. No results for input(s): AMMONIA in the last 168 hours. Coagulation Profile: Recent Labs  Lab 02/11/18 1951  INR 1.05   Cardiac Enzymes: No results for input(s): CKTOTAL, CKMB, CKMBINDEX,  TROPONINI in the last 168 hours. BNP (last 3 results) No results for input(s): PROBNP in the last 8760 hours. HbA1C: Recent Labs    02/12/18 0639  HGBA1C 5.8*   CBG: No results for input(s): GLUCAP in the last 168 hours. Lipid Profile: Recent Labs    02/12/18 0639  CHOL 154  HDL 48  LDLCALC 79  TRIG 136  CHOLHDL 3.2   Thyroid Function Tests: No results for input(s): TSH, T4TOTAL, FREET4, T3FREE, THYROIDAB in the last 72 hours. Anemia Panel: No results for input(s): VITAMINB12, FOLATE, FERRITIN, TIBC, IRON, RETICCTPCT in the last 72 hours. Urine analysis:    Component Value Date/Time    COLORURINE STRAW (A) 02/12/2018 0635   APPEARANCEUR CLEAR 02/12/2018 0635   LABSPEC 1.040 (H) 02/12/2018 0635   PHURINE 5.0 02/12/2018 0635   GLUCOSEU NEGATIVE 02/12/2018 0635   HGBUR NEGATIVE 02/12/2018 0635   BILIRUBINUR NEGATIVE 02/12/2018 0635   KETONESUR NEGATIVE 02/12/2018 0635   PROTEINUR NEGATIVE 02/12/2018 0635   UROBILINOGEN 0.2 07/08/2007 1727   NITRITE NEGATIVE 02/12/2018 0635   LEUKOCYTESUR LARGE (A) 02/12/2018 0635   Sepsis Labs: @LABRCNTIP (procalcitonin:4,lacticidven:4)  ) Recent Results (from the past 240 hour(s))  MRSA PCR Screening     Status: None   Collection Time: 02/12/18 12:40 AM  Result Value Ref Range Status   MRSA by PCR NEGATIVE NEGATIVE Final    Comment:        The GeneXpert MRSA Assay (FDA approved for NASAL specimens only), is one component of a comprehensive MRSA colonization surveillance program. It is not intended to diagnose MRSA infection nor to guide or monitor treatment for MRSA infections. Performed at [facility], [place] [address], [place], [place] [address]       Studies: Ct Angio Head W Or Wo Contrast  Result Date: 02/11/2018 CLINICAL DATA:  84 y/o  F; aphasia, no focal weakness. EXAM: CT ANGIOGRAPHY HEAD AND NECK CT PERFUSION BRAIN TECHNIQUE: Multidetector CT imaging of the head and neck was performed using the standard protocol during bolus administration of intravenous contrast. Multiplanar CT image reconstructions and MIPs were obtained to evaluate the vascular anatomy. Carotid stenosis measurements (when applicable) are obtained utilizing NASCET criteria, using the distal internal carotid diameter as the denominator. Multiphase CT imaging of the brain was performed following IV bolus contrast injection. Subsequent parametric perfusion maps were calculated using RAPID software. CONTRAST:  62mL ISOVUE-370 IOPAMIDOL (ISOVUE-370) INJECTION 76% COMPARISON:  02/11/2018 CT head. FINDINGS: CTA NECK FINDINGS Aortic arch: Standard  branching. Imaged portion shows no evidence of aneurysm or dissection. No significant stenosis of the major arch vessel origins. Mild calcific atherosclerosis. Right carotid system: No evidence of dissection, stenosis (50% or greater) or occlusion. Left carotid system: No evidence of dissection, stenosis (50% or greater) or occlusion. Vertebral arteries: Left dominant. No evidence of dissection, stenosis (50% or greater) or occlusion. Skeleton: Moderate cervical spondylosis with multilevel disc and facet degenerative changes. Canal stenosis is greatest at the C4 through C6 levels where it is at least moderate. Other neck: Negative. Upper chest: Mild biapical pleuroparenchymal scarring. Review of the MIP images confirms the above findings CTA HEAD FINDINGS Anterior circulation: No significant stenosis, proximal occlusion, aneurysm, or vascular malformation. Non stenotic calcific atherosclerosis of carotid siphons. Posterior circulation: No significant stenosis, proximal occlusion, aneurysm, or vascular malformation. Diminutive left PCA with chronic left posterior PCA territory infarction. Multiple segments of mild stenosis and right PCA. Venous sinuses: As permitted by contrast timing, patent. Anatomic variants: Patent circle-of-Willis. Review of the  MIP images confirms the above findings CT Brain Perfusion Findings: CBF (<30%) Volume: 19mL Perfusion (Tmax>6.0s) volume: 36mL Mismatch Volume: 85mL Infarction Location:Negative. IMPRESSION: 1. Patent carotid and vertebral arteries. No dissection, aneurysm, or hemodynamically significant stenosis utilizing NASCET criteria. 2. Patent anterior and posterior intracranial circulation. No large vessel occlusion, aneurysm, or significant stenosis. 3. Negative CT perfusion head. These results were called by telephone at the time of interpretation on 02/11/2018 at 8:24 pm to Dr. Amie Portland , who verbally acknowledged these results. Electronically Signed   By: Kristine Garbe M.D.   On: 02/11/2018 20:27   Ct Angio Neck W Or Wo Contrast  Result Date: 02/11/2018 CLINICAL DATA:  84 y/o  F; aphasia, no focal weakness. EXAM: CT ANGIOGRAPHY HEAD AND NECK CT PERFUSION BRAIN TECHNIQUE: Multidetector CT imaging of the head and neck was performed using the standard protocol during bolus administration of intravenous contrast. Multiplanar CT image reconstructions and MIPs were obtained to evaluate the vascular anatomy. Carotid stenosis measurements (when applicable) are obtained utilizing NASCET criteria, using the distal internal carotid diameter as the denominator. Multiphase CT imaging of the brain was performed following IV bolus contrast injection. Subsequent parametric perfusion maps were calculated using RAPID software. CONTRAST:  21mL ISOVUE-370 IOPAMIDOL (ISOVUE-370) INJECTION 76% COMPARISON:  02/11/2018 CT head. FINDINGS: CTA NECK FINDINGS Aortic arch: Standard branching. Imaged portion shows no evidence of aneurysm or dissection. No significant stenosis of the major arch vessel origins. Mild calcific atherosclerosis. Right carotid system: No evidence of dissection, stenosis (50% or greater) or occlusion. Left carotid system: No evidence of dissection, stenosis (50% or greater) or occlusion. Vertebral arteries: Left dominant. No evidence of dissection, stenosis (50% or greater) or occlusion. Skeleton: Moderate cervical spondylosis with multilevel disc and facet degenerative changes. Canal stenosis is greatest at the C4 through C6 levels where it is at least moderate. Other neck: Negative. Upper chest: Mild biapical pleuroparenchymal scarring. Review of the MIP images confirms the above findings CTA HEAD FINDINGS Anterior circulation: No significant stenosis, proximal occlusion, aneurysm, or vascular malformation. Non stenotic calcific atherosclerosis of carotid siphons. Posterior circulation: No significant stenosis, proximal occlusion, aneurysm, or vascular  malformation. Diminutive left PCA with chronic left posterior PCA territory infarction. Multiple segments of mild stenosis and right PCA. Venous sinuses: As permitted by contrast timing, patent. Anatomic variants: Patent circle-of-Willis. Review of the MIP images confirms the above findings CT Brain Perfusion Findings: CBF (<30%) Volume: 79mL Perfusion (Tmax>6.0s) volume: 46mL Mismatch Volume: 53mL Infarction Location:Negative. IMPRESSION: 1. Patent carotid and vertebral arteries. No dissection, aneurysm, or hemodynamically significant stenosis utilizing NASCET criteria. 2. Patent anterior and posterior intracranial circulation. No large vessel occlusion, aneurysm, or significant stenosis. 3. Negative CT perfusion head. These results were called by telephone at the time of interpretation on 02/11/2018 at 8:24 pm to Dr. Amie Portland , who verbally acknowledged these results. Electronically Signed   By: Kristine Garbe M.D.   On: 02/11/2018 20:27   Mr Brain Wo Contrast  Result Date: 02/11/2018 CLINICAL DATA:  84 y/o  F; code stroke, limited study. EXAM: MRI HEAD WITHOUT CONTRAST TECHNIQUE: Axial DWI, coronal DWI, axial T2 FLAIR, axial SWAN sequences were acquired. COMPARISON:  02/11/2018 CT.  11/02/2015 MRI of the head. FINDINGS: Brain: Left lateral temporal lobe region of mildly reduced diffusion measuring 5.4 x 2.6 x 2.0 cm (volume = 15 cm^3) corresponding to hypoattenuation on CT head. The region of reduced diffusion demonstrates T2 FLAIR hyperintensity and mild local mass effect. Findings are compatible with a subacute infarction.  No additional reduced diffusion to suggest acute or early subacute infarction. No abnormal susceptibility hypointensity to indicate acute hemorrhage. Chronic infarcts are present within the right parietal lobe, bilateral occipital lobes, and right inferior cerebellar hemisphere. There is a background of mild chronic microvascular ischemic changes and parenchymal volume loss of the  brain. No extra-axial collection or effacement of basilar cisterns. Vascular: Normal flow voids. Skull and upper cervical spine: No abnormal diffusion, FLAIR, or SWI signal. Sinuses/Orbits: Negative. Other: None. IMPRESSION: 1. Subacute left lateral temporal lobe infarction, approximately 15 cc. Mild local mass effect. No hemorrhage. 2. No additional acute or early subacute infarction of the brain. 3. Multiple additional stable chronic infarcts, chronic microvascular ischemic changes, parenchymal volume loss. Electronically Signed   By: Kristine Garbe M.D.   On: 02/11/2018 21:00   Ct Cerebral Perfusion W Contrast  Result Date: 02/11/2018 CLINICAL DATA:  84 y/o  F; aphasia, no focal weakness. EXAM: CT ANGIOGRAPHY HEAD AND NECK CT PERFUSION BRAIN TECHNIQUE: Multidetector CT imaging of the head and neck was performed using the standard protocol during bolus administration of intravenous contrast. Multiplanar CT image reconstructions and MIPs were obtained to evaluate the vascular anatomy. Carotid stenosis measurements (when applicable) are obtained utilizing NASCET criteria, using the distal internal carotid diameter as the denominator. Multiphase CT imaging of the brain was performed following IV bolus contrast injection. Subsequent parametric perfusion maps were calculated using RAPID software. CONTRAST:  20mL ISOVUE-370 IOPAMIDOL (ISOVUE-370) INJECTION 76% COMPARISON:  02/11/2018 CT head. FINDINGS: CTA NECK FINDINGS Aortic arch: Standard branching. Imaged portion shows no evidence of aneurysm or dissection. No significant stenosis of the major arch vessel origins. Mild calcific atherosclerosis. Right carotid system: No evidence of dissection, stenosis (50% or greater) or occlusion. Left carotid system: No evidence of dissection, stenosis (50% or greater) or occlusion. Vertebral arteries: Left dominant. No evidence of dissection, stenosis (50% or greater) or occlusion. Skeleton: Moderate cervical  spondylosis with multilevel disc and facet degenerative changes. Canal stenosis is greatest at the C4 through C6 levels where it is at least moderate. Other neck: Negative. Upper chest: Mild biapical pleuroparenchymal scarring. Review of the MIP images confirms the above findings CTA HEAD FINDINGS Anterior circulation: No significant stenosis, proximal occlusion, aneurysm, or vascular malformation. Non stenotic calcific atherosclerosis of carotid siphons. Posterior circulation: No significant stenosis, proximal occlusion, aneurysm, or vascular malformation. Diminutive left PCA with chronic left posterior PCA territory infarction. Multiple segments of mild stenosis and right PCA. Venous sinuses: As permitted by contrast timing, patent. Anatomic variants: Patent circle-of-Willis. Review of the MIP images confirms the above findings CT Brain Perfusion Findings: CBF (<30%) Volume: 44mL Perfusion (Tmax>6.0s) volume: 3mL Mismatch Volume: 74mL Infarction Location:Negative. IMPRESSION: 1. Patent carotid and vertebral arteries. No dissection, aneurysm, or hemodynamically significant stenosis utilizing NASCET criteria. 2. Patent anterior and posterior intracranial circulation. No large vessel occlusion, aneurysm, or significant stenosis. 3. Negative CT perfusion head. These results were called by telephone at the time of interpretation on 02/11/2018 at 8:24 pm to Dr. Amie Portland , who verbally acknowledged these results. Electronically Signed   By: Kristine Garbe M.D.   On: 02/11/2018 20:27   Ct Head Code Stroke Wo Contrast  Result Date: 02/11/2018 CLINICAL DATA:  Code stroke.  84 y/o  F; aphasia, no focal weakness. EXAM: CT HEAD WITHOUT CONTRAST TECHNIQUE: Contiguous axial images were obtained from the base of the skull through the vertex without intravenous contrast. COMPARISON:  06/25/2017 CT head FINDINGS: Brain: Left lateral temporal lobe and temporal operculum region  of hypoattenuation likely representing late  acute to subacute infarction. No hemorrhage or mass effect identified. Stable chronic infarcts in right parietal as well as bilateral occipital lobes and the right inferior cerebellar hemisphere. Stable chronic microvascular ischemic changes and parenchymal volume loss of the brain. Vascular: Calcific atherosclerosis of carotid siphons. No hyperdense vessel identified. Skull: Normal. Negative for fracture or focal lesion. Sinuses/Orbits: No acute finding. Other: None. ASPECTS [facility] Stroke Program Early CT Score) - Ganglionic level infarction (caudate, lentiform nuclei, internal capsule, insula, M1-M3 cortex): 5 - Supraganglionic infarction (M4-M6 cortex): 3 Total score (0-10 with 10 being normal): IMPRESSION: 1. Left lateral temporal lobe region of hypoattenuation likely representing late acute to subacute infarction. No hemorrhage or mass effect. 2. ASPECTS is 8 3. Multiple stable chronic infarctions, chronic microvascular ischemic changes, and parenchymal volume loss of the brain are otherwise stable. These results were communicated to Dr. Rory Percy at 8:05 pmon 3/5/2019by text page via the [facility] messaging system. Electronically Signed   By: Kristine Garbe M.D.   On: 02/11/2018 20:05    Scheduled Meds: . ARIPiprazole  1 mg Oral Daily  . atorvastatin  80 mg Oral Daily  . busPIRone  30 mg Oral BID  . calcium-vitamin D  1 tablet Oral BID  . clopidogrel  75 mg Oral Daily  . enoxaparin (LOVENOX) injection  40 mg Subcutaneous Q24H  . Melatonin  3 mg Oral QHS  . senna-docusate  1 tablet Oral QHS  . sertraline  100 mg Oral Daily    Continuous Infusions: . sodium chloride 75 mL/hr at 02/12/18 0300     LOS: 1 day     Kayleen Memos, MD [facility] Pager [phone]  If 7PM-7AM, please contact night-coverage [email or web address] Password [email or web address] 02/12/2018, 9:04 AM

## 2018-02-12 NOTE — Progress Notes (Addendum)
STROKE TEAM PROGRESS NOTE  Nancy Rowland is a 84 y.o. female who has a past medical history of depression, anxiety, chronic pain, hypertension, hyperlipidemia who is a resident at a nursing facility and reportedly was normal when she was taken back to her room after dinner at 5:20 PM on 02/11/2018.  Her daughter came by and noticed that she was able to follow commands but not able to talk normally.  Because of her history of prior strokes as well as these new symptoms, EMS was called.  EMS noted that she had severe expressive aphasia with no focal weakness.  She was brought into the emergency room as an acute code stroke because of the last seen normal being 5:20 PM. Upon initial evaluation on the emergency room bridge, the patient had no focal motor or sensory signs but had expressive aphasia.  She was not mute but had a difficult time naming pictures on the NIH score card as well as with repetition. She was not able to reliably provide any history because of her aphasia.   LKW: Reported 5:20 PM on 02/11/2018 tpa given?: no, CT scan suggestive of established hypodensity in the left temporal region which is new from CT scan of 2018 and appears subacute in nature. Premorbid modified Rankin scale (mRS): At least 3   SUBJECTIVE (INTERVAL HISTORY) No family is at the bedside.  She has poor recall. He states her speech has improved significantly   OBJECTIVE Vitals:   02/12/18 0300 02/12/18 0500 02/12/18 0801 02/12/18 1220  BP: (!) 121/59 119/65 (!) 134/57 138/61  Pulse:   62 68  Resp: 17 17 16 16   Temp: (!) 97.3 F (36.3 C) 98.2 F (36.8 C) 97.9 F (36.6 C) 97.7 F (36.5 C)  TempSrc: Oral Oral Oral Oral  SpO2: 95% 96% 99% 99%  Weight:      Height:        CBC:  Recent Labs  Lab 02/11/18 1951 02/11/18 1956  WBC 7.9  --   NEUTROABS 4.9  --   HGB 11.3* 10.9*  HCT 35.9* 32.0*  MCV 94.2  --   PLT 176  --     Basic Metabolic Panel:  Recent Labs  Lab 02/11/18 1951 02/11/18 1956   NA 140 143  K 4.1 4.1  CL 112* 112*  CO2 19*  --   GLUCOSE 177* 172*  BUN 25* 28*  CREATININE 1.18* 1.20*  CALCIUM 8.6*  --     PHYSICAL EXAM Pleasant elderly Caucasian lady currently not in distress.  . Afebrile. Head is nontraumatic. Neck is supple without bruit.    Cardiac exam no murmur or gallop. Lungs are clear to auscultation. Distal pulses are well felt. Neurological Exam ;  Awake  Alert oriented x 3. Slightly non-fluent speech and few word finding difficulties.eye movements full without nystagmus.fundi were not visualized. Vision acuity and fields appear normal. Hearing is normal. Palatal movements are normal. Face symmetric. Tongue midline. Normal strength, tone, reflexes and coordination. Normal sensation. Gait deferred.  ASSESSMENT/PLAN Nancy Rowland is a 84 y.o. female with history of depression, anxiety, chronic pain, HTN, HLD & prior embolic stroke now residing in SNF presenting with expressive aphasia. She did not receive IV t-PA due to presence of subacute stroke already on CT.   Stroke:   L temporal infarct embolic secondary to unknown source Has had old embolic strokes w/ neg TEE and neg 30d cardiac monitoring. Given advanced age w/ current deficits and residence in [place], do  not recommend loop placement at this time. Remain suspicious for cardiac source.  Resultant  Expressive aphasia  Code Stroke CT L lateral temp lobe subacute infarct. ASPECTS 8. Old infarcts. Small vessel disease. Atrophy.   CTA head & neck no LVO, patent carotid and vertebral arteries  CT perfusion negative  MRI  Subacute L temp lobe infarct. No hmg. Mult old infarcts. atrophy  2D Echo  EF 60-65%. No source of embolus   LDL 79  HgbA1c 5.8  Lovenox 40 mg sq daily for VTE prophylaxis  Fall precautions  Diet Heart Room service appropriate? Yes; Fluid consistency: Thin  clopidogrel 75 mg daily prior to admission, now on clopidogrel 75 mg daily. Given new stroke, would put on DAPT  x 3 weeks then back to plavix alone  Therapy recommendations:  SNF  Disposition:  Return to SNF  Hypertension  Stable Permissive hypertension (OK if < 220/120) but gradually normalize in 5-7 days Long-term BP goal normotensive  Hyperlipidemia  Home meds:  lipitor 10, increased to lipitor 80 in hospital  LDL 79 goal < 70  Ok to lower lipitor dose at time of discharge  Other Stroke Risk Factors  Advanced age  UDS / ETOH level negative  Hx stroke/TIA - embolic, neg tee and OP cardiac monitoring in the past. Treated w/ DAPT at that time. Now on plavix  Hospital day # 1  NOTHING FURTHER TO ADD FROM THE STROKE STANDPOINT  Patient has a 10-15% risk of having another stroke over the next year, the highest risk is within 2 weeks of the most recent stroke/TIA (risk of having a stroke following a stroke or TIA is the same).  Ongoing risk factor control by Primary Care Physician  Stroke Service will sign off. Please call should any needs arise.  Follow-up Stroke Clinic at [facility] with Dr. Antony Contras in 6 weeks  [place] for Pager information 02/12/2018 3:26 PM   I have personally examined this patient, reviewed notes, independently viewed imaging studies, participated in medical decision making and plan of care.ROS completed by me personally and pertinent positives fully documented  I have made any additions or clarifications directly to the above note. Agree with note above. Greater than 50% time during this 25 minute visit was spent on counseling and coordination of care about her stroke and answering questions  Antony Contras, [place] Pager: [phone] 02/12/2018 5:31 PM   To contact Stroke Continuity provider, please refer to [email or web address] After hours, contact General Neurology

## 2018-02-12 NOTE — Evaluation (Signed)
Speech Language Pathology Evaluation Patient Details Name: Nancy Rowland MRN: [id number] DOB: 1929-06-19 Today's Date: 02/12/2018 Time: 7628-3151 SLP Time Calculation (min) (ACUTE ONLY): 45 min  Problem List:  Patient Active Problem List   Diagnosis Date Noted  . Depression 02/11/2018  . CKD (chronic kidney disease), stage III ([place]) 02/11/2018  . Nondisplaced comminuted fracture of left patella, initial encounter for closed fracture 07/15/2017  . Pyuria 06/26/2017  . Dementia 06/25/2017  . Fall 06/25/2017  . Closed fracture of left distal radius 05/27/2017  . Ischemic stroke ([place])   . History of stroke   . HLD (hyperlipidemia)   . Essential hypertension   . TIA (transient ischemic attack) 11/02/2015   Past Medical History:  Past Medical History:  Diagnosis Date  . Cancer ([place])   . Dementia   . Depression   . Stroke [facility]) 08/2014   Past Surgical History:  Past Surgical History:  Procedure Laterality Date  . ABDOMINAL HYSTERECTOMY    . TEE WITHOUT CARDIOVERSION N/A 11/04/2015   Procedure: TRANSESOPHAGEAL ECHOCARDIOGRAM (TEE);  Surgeon: Skeet Latch, MD;  Location: [facility] ENDOSCOPY;  Service: Cardiovascular;  Laterality: N/A;   HPI:  84 year old female admitted 02/11/18 with aphasia. PMH significant for CVA (x3), HTN, HLD, depression, dementia, CK3. Orders received for speech/language evaluation   Assessment / Plan / Recommendation Clinical Impression  Pt presents with significant receptive and expressive aphasia, characterized as follows: RECEPTIVE LANGUAGE - AUDITORY COMPREHENSION: Pt responses to even simple yes/no questions are not accurate, with "Yes" being the dominant response. She is able to follow simple personal one-step verbal directions, but benefits from gestures to improve accuracy. Pt is accurate for identification of body parts, but not items around her room.  RECEPTIVE LANGUAGE - READING COMPREHENSION: Pt demonstrates comprehension of short written commands  (close your eyes, smile, touch your nose).  EXPRESSIVE LANGUAGE - VERBAL EXPRESSION: Pt requires mod assist to provide automatic sequences (numbers, days of the week and months of the year). She was unable to sing the alphabet or happy birthday, even with verbal and visual cues. Social exchanges are inconsistent. Pt ability to repeat words and phrases is also inconsistent, with frequent neologisms noted. Neologisms are predominant when naming common objects as well. Responsive naming is 30% accurate. Pt appears unaware of the deficits of her fluent verbal expression.   COGNITION: Pt may have baseline cognitive deficits related to history of dementia. Assessment of cognitive status at this time will be unreliable given severity of receptive and expressive language deficits.  SPEECH: Pt intelligibility of speech is WFL. There is no overt oral motor weakness or asymmetry.  Pt would benefit from continued skilled ST intervention to maximize communicative effectiveness and decrease caregiver burden. Pt's daughter was provided with a communication board to facilitate the process of meeting pt needs and wants. ST will follow acutely. Further ST intervention is anticipated at DC.    SLP Assessment  SLP Recommendation/Assessment: Patient needs continued Speech Language Pathology Services SLP Visit Diagnosis: Aphasia (R47.01)    Follow Up Recommendations  24 hour supervision/assistance;Skilled Nursing facility    Frequency and Duration min 2x/week  2 weeks      SLP Evaluation Cognition  Overall Cognitive Status: History of cognitive impairments - at baseline(dementia at baseline) Arousal/Alertness: Awake/alert Orientation Level: (unable to assess due to severity of receptive and expressive aphasia)       Comprehension  Auditory Comprehension Overall Auditory Comprehension: Impaired Yes/No Questions: Impaired Basic Biographical Questions: 26-50% accurate Basic Immediate Environment Questions:  0-24% accurate Complex Questions: 0-24% accurate Commands: Impaired One Step Basic Commands: (accuracy improves with gestures) Two Step Basic Commands: 0-24% accurate Conversation: Simple Interfering Components: Hearing EffectiveTechniques: Increased volume;Slowed speech;Visual/Gestural cues;Repetition Reading Comprehension Reading Status: Impaired Word level: Within functional limits Sentence Level: Within functional limits Paragraph Level: Impaired Functional Environmental (signs, name badge): Impaired Interfering Components: Eye glasses not available Effective Techniques: Visual cueing    Expression Expression Primary Mode of Expression: Verbal Verbal Expression Overall Verbal Expression: Impaired Initiation: No impairment Automatic Speech: Name Level of Generative/Spontaneous Verbalization: Phrase Repetition: Impaired Level of Impairment: Word level;Phrase level;Sentence level Naming: Impairment Responsive: 0-25% accurate Confrontation: Impaired Convergent: 0-24% accurate Divergent: 0-24% accurate Verbal Errors: Neologisms;Perseveration;Not aware of errors Pragmatics: No impairment Effective Techniques: Semantic cues Written Expression Dominant Hand: Right Written Expression: Not tested   Oral / Motor  Oral Motor/Sensory Function Overall Oral Motor/Sensory Function: Within functional limits Motor Speech Overall Motor Speech: Appears within functional limits for tasks assessed   GO                   Corran Lalone B. [facility], CCC-SLP Speech Language Pathologist [phone]  Shonna Chock 02/12/2018, 1:43 PM

## 2018-02-13 ENCOUNTER — Encounter (HOSPITAL_COMMUNITY): Payer: Self-pay

## 2018-02-13 DIAGNOSIS — R4701 Aphasia: Secondary | ICD-10-CM

## 2018-02-13 DIAGNOSIS — F329 Major depressive disorder, single episode, unspecified: Secondary | ICD-10-CM

## 2018-02-13 LAB — CBC
HEMATOCRIT: 33.7 % — AB (ref 36.0–46.0)
Hemoglobin: 11.2 g/dL — ABNORMAL LOW (ref 12.0–15.0)
MCH: 30.7 pg (ref 26.0–34.0)
MCHC: 33.2 g/dL (ref 30.0–36.0)
MCV: 92.3 fL (ref 78.0–100.0)
PLATELETS: 158 10*3/uL (ref 150–400)
RBC: 3.65 MIL/uL — AB (ref 3.87–5.11)
RDW: 14.5 % (ref 11.5–15.5)
WBC: 6.5 10*3/uL (ref 4.0–10.5)

## 2018-02-13 LAB — BASIC METABOLIC PANEL
Anion gap: 8 (ref 5–15)
BUN: 14 mg/dL (ref 6–20)
CHLORIDE: 114 mmol/L — AB (ref 101–111)
CO2: 18 mmol/L — ABNORMAL LOW (ref 22–32)
Calcium: 8.1 mg/dL — ABNORMAL LOW (ref 8.9–10.3)
Creatinine, Ser: 1.07 mg/dL — ABNORMAL HIGH (ref 0.44–1.00)
GFR, EST AFRICAN AMERICAN: 53 mL/min — AB (ref >60)
GFR, EST NON AFRICAN AMERICAN: 46 mL/min — AB (ref >60)
Glucose, Bld: 146 mg/dL — ABNORMAL HIGH (ref 65–99)
POTASSIUM: 4 mmol/L (ref 3.5–5.1)
SODIUM: 140 mmol/L (ref 135–145)

## 2018-02-13 NOTE — Progress Notes (Signed)
PROGRESS NOTE  Nancy Rowland SNK:[id number] DOB: 08/18/1929 DOA: 02/11/2018 PCP: System, Pcp Not In  HPI/Recap of past 24 hours: Nancy Rowland is a 84 y.o. female with medical history significant of stroke, hypertension, hyperlipidemia, depression, dementia, CK-3, who presents with aphasia.  Admitted for stroke workup.  MRI revealed left lateral temporal lobe infarction.  Neurology consulted and following.  02/12/2018: Patient seen and examined at her bedside.  Reports severe headache.  Headache cocktail was given with improvement of symptoms.  Denies change in vision or nausea.  Patient is alert but confused in the setting of advanced dementia.   Assessment/Plan: Principal Problem:   Ischemic stroke ([place]) Active Problems:   HLD (hyperlipidemia)   Essential hypertension   Dementia   Depression   CKD (chronic kidney disease), stage III (HCC)  Acute CVA left lateral temporal lobe -Neurology following -Loop recorder recommended by neurology -Continue aspirin and Plavix per neuro -speech therapy recommended skilled nursing facility -PT recommended home health PT -OT recommended home health OT -LDL 79 goal 70 -A1c 5.8 -Fall precaution -Negative prior TEE and negative prior 30-day cardiac monitoring  Headache, severe -Headaches cocktail given with IV Toradol IV Benadryl and IV Reglan once -Improvement of symptoms post above treatment  Hypertension -Blood pressure stable -Continue meds  Hyperlipidemia -Continue statin -LDL goal less than 70  Depression -Continue home meds Abilify Zoloft BuSpar  CKD 3, improving -Baseline creatinine 1.2 -Creatinine of 1.07 from 1.20   Code Status: DNR  Family Communication: None at bedside  Disposition Plan: Skilled nursing facility tomorrow if no events of the night   Consultants:  Neurology  Procedures:  None  Antimicrobials:  None  DVT prophylaxis: SCDs; subcu Lovenox   Objective: Vitals:   02/13/18 0422  02/13/18 0800 02/13/18 1237 02/13/18 1549  BP: (!) 150/76 135/73 98/81 (!) 147/72  Pulse: 74 74 81 80  Resp: 18  20 20   Temp: 97.7 F (36.5 C) 98.1 F (36.7 C) 98.3 F (36.8 C) 98.5 F (36.9 C)  TempSrc: Oral Oral Oral Oral  SpO2: 95% 98% 100% 96%  Weight:      Height:       No intake or output data in the 24 hours ending 02/13/18 1808 Filed Weights   02/11/18 2057  Weight: 77.1 kg (169 lb 15.6 oz)    Exam: 02/13/2018.  Patient seen and examined at her bedside.  Physical exam unchanged from prior.   General: 84 year old female in no acute distress.  Has expressive aphasia  cardiovascular: Regular rate and rhythm no rubs or gallops  Respiratory: Mild rales at bases  Abdomen: Normal bowel sounds   Musculoskeletal: Normal strength   skin: No rash  Psychiatry: Mood is appropriate   Data Reviewed: CBC: Recent Labs  Lab 02/11/18 1951 02/11/18 1956 02/13/18 1038  WBC 7.9  --  6.5  NEUTROABS 4.9  --   --   HGB 11.3* 10.9* 11.2*  HCT 35.9* 32.0* 33.7*  MCV 94.2  --  92.3  PLT 176  --  937   Basic Metabolic Panel: Recent Labs  Lab 02/11/18 1951 02/11/18 1956 02/13/18 1038  NA 140 143 140  K 4.1 4.1 4.0  CL 112* 112* 114*  CO2 19*  --  18*  GLUCOSE 177* 172* 146*  BUN 25* 28* 14  CREATININE 1.18* 1.20* 1.07*  CALCIUM 8.6*  --  8.1*   GFR: Estimated Creatinine Clearance: 39.6 mL/min (A) (by C-G formula based on SCr of 1.07 mg/dL (H)). Liver  Function Tests: Recent Labs  Lab 02/11/18 1951  AST 21  ALT 17  ALKPHOS 77  BILITOT 0.4  PROT 6.2*  ALBUMIN 3.7   No results for input(s): LIPASE, AMYLASE in the last 168 hours. No results for input(s): AMMONIA in the last 168 hours. Coagulation Profile: Recent Labs  Lab 02/11/18 1951  INR 1.05   Cardiac Enzymes: No results for input(s): CKTOTAL, CKMB, CKMBINDEX, TROPONINI in the last 168 hours. BNP (last 3 results) No results for input(s): PROBNP in the last 8760 hours. HbA1C: Recent Labs     02/12/18 0639  HGBA1C 5.8*   CBG: Recent Labs  Lab 02/11/18 1948  GLUCAP 162*   Lipid Profile: Recent Labs    02/12/18 0639  CHOL 154  HDL 48  LDLCALC 79  TRIG 136  CHOLHDL 3.2   Thyroid Function Tests: No results for input(s): TSH, T4TOTAL, FREET4, T3FREE, THYROIDAB in the last 72 hours. Anemia Panel: No results for input(s): VITAMINB12, FOLATE, FERRITIN, TIBC, IRON, RETICCTPCT in the last 72 hours. Urine analysis:    Component Value Date/Time   COLORURINE STRAW (A) 02/12/2018 0635   APPEARANCEUR CLEAR 02/12/2018 0635   LABSPEC 1.040 (H) 02/12/2018 0635   PHURINE 5.0 02/12/2018 0635   GLUCOSEU NEGATIVE 02/12/2018 0635   HGBUR NEGATIVE 02/12/2018 0635   BILIRUBINUR NEGATIVE 02/12/2018 0635   KETONESUR NEGATIVE 02/12/2018 0635   PROTEINUR NEGATIVE 02/12/2018 0635   UROBILINOGEN 0.2 07/08/2007 1727   NITRITE NEGATIVE 02/12/2018 0635   LEUKOCYTESUR LARGE (A) 02/12/2018 0635   Sepsis Labs: @LABRCNTIP (procalcitonin:4,lacticidven:4)  ) Recent Results (from the past 240 hour(s))  MRSA PCR Screening     Status: None   Collection Time: 02/12/18 12:40 AM  Result Value Ref Range Status   MRSA by PCR NEGATIVE NEGATIVE Final    Comment:        The GeneXpert MRSA Assay (FDA approved for NASAL specimens only), is one component of a comprehensive MRSA colonization surveillance program. It is not intended to diagnose MRSA infection nor to guide or monitor treatment for MRSA infections. Performed at [facility], [address], [place], [place] [address]       Studies: No results found.  Scheduled Meds: . ARIPiprazole  1 mg Oral Daily  . aspirin EC  81 mg Oral Daily  . atorvastatin  80 mg Oral Daily  . busPIRone  30 mg Oral BID  . calcium-vitamin D  1 tablet Oral BID  . clopidogrel  75 mg Oral Daily  . enoxaparin (LOVENOX) injection  40 mg Subcutaneous Q24H  . Melatonin  3 mg Oral QHS  . senna-docusate  1 tablet Oral QHS  . sertraline  100 mg Oral  Daily    Continuous Infusions: . sodium chloride 75 mL/hr at 02/13/18 1651     LOS: 2 days     Kayleen Memos, MD [facility] Pager [phone]  If 7PM-7AM, please contact night-coverage [email or web address] Password [facility] 02/13/2018, 6:08 PM

## 2018-02-13 NOTE — Care Management Note (Signed)
Case Management Note  Patient Details  Name: Nancy Rowland MRN: [id number] Date of Birth: Feb 08, 1929  Subjective/Objective:     Pt admitted with CVA from [place].                Action/Plan: PT/OT recommending [place] services. CM spoke to [facility] and they have inhouse therapy services and will just need orders faxed. CSW will fax the orders with other paperwork. No other needs per CM.  Expected Discharge Date:                  Expected Discharge Plan:  Assisted Living / Rest Home  In-House Referral:  Clinical Social Work  Discharge planning Services  CM Consult  Post Acute Care Choice:  Home Health Choice offered to:     DME Arranged:    DME Agency:     HH Arranged:    [place] Agency:  Secondary school teacher)  Status of Service:  In process, will continue to follow  If discussed at Long Length of Stay Meetings, dates discussed:    Additional Comments:  Pollie Friar, RN 02/13/2018, 11:41 AM

## 2018-02-14 DIAGNOSIS — R262 Difficulty in walking, not elsewhere classified: Secondary | ICD-10-CM

## 2018-02-14 MED ORDER — ATORVASTATIN CALCIUM 80 MG PO TABS: 80.0000 mg | ORAL_TABLET | Freq: Every day | ORAL | 0 refills | Status: AC

## 2018-02-14 MED ORDER — ASPIRIN 81 MG PO TBEC: 81.0000 mg | DELAYED_RELEASE_TABLET | Freq: Every day | ORAL | 0 refills | Status: AC

## 2018-02-14 MED ORDER — CLOPIDOGREL BISULFATE 75 MG PO TABS: 75.0000 mg | ORAL_TABLET | Freq: Every day | ORAL | 0 refills | Status: AC

## 2018-02-14 NOTE — Progress Notes (Signed)
Discharge to: [facility] Anticipated discharge date: 02/14/18 Family notified: Luvenia Redden Transportation by: [facility]  Report #: [phone]  [place] signing off.  Laveda Abbe LCSW [phone]

## 2018-02-14 NOTE — Discharge Instructions (Signed)
Ischemic Stroke °An ischemic stroke is the sudden death of brain tissue. Blood carries oxygen to all areas of the body. This type of stroke happens when your blood does not flow to your brain like normal. Your brain cannot get the oxygen it needs. This is an emergency. It must be treated right away. °Symptoms of a stroke usually happen all of a sudden. You may notice them when you wake up. They can include: °· Weakness or loss of feeling in your face, arm, or leg. This often happens on one side of the body. °· Trouble walking. °· Trouble moving your arms or legs. °· Loss of balance or coordination. °· Feeling confused. °· Trouble talking or understanding what people are saying. °· Slurred speech. °· Trouble seeing. °· Seeing two of one object (double vision). °· Feeling dizzy. °· Feeling sick to your stomach (nauseous) and throwing up (vomiting). °· A very bad headache for no reason. ° °Get help as soon as any of these problems start. This is important. Some treatments work better if they are given right away. These include: °· Aspirin. °· Medicines to control blood pressure. °· A shot (injection) of medicine to break up the blood clot. °· Treatments given in the blood vessel (artery) to take out the clot or break it up. ° °Other treatments may include: °· Oxygen. °· Fluids given through an IV tube. °· Medicines to thin out your blood. °· Procedures to help your blood flow better. ° °What increases the risk? °Certain things may make you more likely to have a stroke. Some of these are things that you can change, such as: °· Being very overweight (obesity). °· Smoking. °· Taking birth control pills. °· Not being active. °· Drinking too much alcohol. °· Using drugs. ° °Other risk factors include: °· High blood pressure. °· High cholesterol. °· Diabetes. °· Heart disease. °· Being African American, Native American, Hispanic, or [place] Native. °· Being over age 60. °· Family history of stroke. °· Having had blood clots,  stroke, or warning stroke (transient ischemic attack, TIA) in the past. °· Sickle cell disease. °· Being a woman with a history of high blood pressure in pregnancy (preeclampsia). °· Migraine headache. °· Sleep apnea. °· Having an irregular heartbeat (atrial fibrillation). °· Long-term (chronic) diseases that cause soreness and swelling (inflammation). °· Disorders that affect how your blood clots. ° °Follow these instructions at home: °Medicines °· Take over-the-counter and prescription medicines only as told by your doctor. °· If you were told to take aspirin or another medicine to thin your blood, take it exactly as told by your doctor. °? Taking too much of the medicine can cause bleeding. °? If you do not take enough, it may not work as well. °· Know the side effects of your medicines. If you are taking a blood thinner, make sure you: °? Hold pressure over any cuts for longer than usual. °? Tell your dentist and other doctors that you take this medicine. °? Avoid activities that may cause damage or injury to your body. °Eating and drinking °· Follow instructions from your doctor about what you cannot eat or drink. °· Eat healthy foods. °· If you have trouble with swallowing, do these things to avoid choking: °? Take small bites when eating. °? Eat foods that are soft or pureed. °Safety °· Follow instructions from your health care team about physical activity. °· Use a walker or cane as told by your doctor. °· Keep your home safe so   you do not fall. This may include: ? Having experts look at your home to make sure it is safe. ? Putting grab bars in the bedroom and bathroom. ? Using raised toilets. ? Putting a seat in the shower. General instructions  Do not use any tobacco products. ? Examples of these are cigarettes, chewing tobacco, and e-cigarettes. ? If you need help quitting, ask your doctor.  Limit how much alcohol you drink. This means no more than 1 drink a day for nonpregnant women and 2  drinks a day for men. One drink equals 12 oz of beer, 5 oz of wine, or 1 oz of hard liquor.  If you need help to stop using drugs or alcohol, ask your doctor to refer you to a program or specialist.  Stay active. Exercise as told by your doctor.  Keep all follow-up visits as told by your doctor. This is important. Get help right away if:  You suddenly: ? Have weakness or loss of feeling in your face, arm, or leg. ? Feel confused. ? Have trouble talking or understanding what people are saying. ? Have trouble seeing. ? Have trouble walking. ? Have trouble moving your arms or legs. ? Feel dizzy. ? Lose your balance or coordination. ? Have a very bad headache and you do not know why.  You pass out (lose consciousness) or almost pass out.  You have jerky movements that you cannot control (seizure). These symptoms may be an emergency. Do not wait to see if the symptoms will go away. Get medical help right away. Call your local emergency services (911 in the [place] Do not drive yourself to the hospital. This information is not intended to replace advice given to you by your health care provider. Make sure you discuss any questions you have with your health care provider. Document Released: 11/15/2011 Document Revised: 05/08/2016 Document Reviewed: 02/22/2016 Elsevier Interactive Patient Education  2018 [facility] Discharge After a Stroke  Being discharged from the hospital after a stroke can feel overwhelming. Many things may be different, and it is normal to feel scared or anxious. Some stroke survivors may be able to return to their homes, and others may need more specialized care on a temporary or permanent basis. Your stroke care team will work with you to develop a discharge plan that is best for you. Ask questions if you do not understand something. Invite a friend or family member to participate in discharge planning. Understanding and following your discharge plan can  help to prevent another stroke or other problems. Understanding your medicines After a stroke, your health care provider may prescribe one or more types of medicine. It is important to take medicines exactly as told by your health care provider. Serious harm, such as another stroke, can happen if you are unable to take your medicine exactly as prescribed. Make sure you understand:  What medicine to take.  Why you are taking the medicine.  How and when to take it.  If it can be taken with your other medicines and herbal supplements.  Possible side effects.  When to call your health care provider if you have any side effects.  How you will get and pay for your medicines. Medical assistance programs may be able to help you pay for prescription medicines if you cannot afford them.  If you are taking an anticoagulant, be sure to take it exactly as told by your health care provider. This type of medicine can increase  the risk of bleeding because it works to prevent blood from clotting. You may need to take certain precautions to prevent bleeding. You should contact your health care provider if you have:  Bleeding or bruising.  A fall or other injury to your head.  Blood in your urine or stool (feces).  Planning for home safety Take steps to prevent falls, such as installing grab bars or using a shower chair. Ask a friend or family member to get needed things in place before you go home if possible. A therapist can come to your home to make recommendations for safety equipment. Ask your health care provider if you would benefit from this service or from home care. Getting needed equipment Ask your health care provider for a list of any medical equipment and supplies you will need at home. These may include items such as:  Walkers.  Canes.  Wheelchairs.  Hand-strengthening devices.  Special eating utensils.  Medical equipment can be rented or purchased, depending on your insurance  coverage. Check with your insurance company about what is covered. Keeping follow-up visits After a stroke, you will need to follow up regularly with a health care provider. You may also need rehabilitation, which can include physical therapy, occupational therapy, or speech-language therapy. Keeping these appointments is very important to your recovery after a stroke. Be sure to bring your medicine list and discharge papers with you to your appointments. If you need help to keep track of your schedule, use a calendar or appointment reminder. Preventing another stroke Having a stroke puts you at risk for another stroke in the future. Ask your health care provider what actions you can take to lower the risk. These may include:  Increasing how much you exercise.  Making a healthy eating plan.  Quitting smoking.  Managing other health conditions, such as high blood pressure, high cholesterol, or diabetes.  Limiting alcohol use.  Knowing the warning signs of a stroke Make sure you understand the signs of a stroke. Before you leave the hospital, you will receive information outlining the stroke warning signs. Share these with your friends and family members. "BE FAST" is an easy way to remember the main warning signs of a stroke:  B - Balance. Signs are dizziness, sudden trouble walking, or loss of balance.  E - Eyes. Signs are trouble seeing or a sudden change in vision.  F - Face. Signs are sudden weakness or numbness of the face, or the face or eyelid drooping on one side.  A - Arms. Signs are weakness or numbness in an arm. This happens suddenly and usually on one side of the body.  S - Speech. Signs are sudden trouble speaking, slurred speech, or trouble understanding what people say.  T - Time. Time to call emergency services. Write down what time symptoms started.  Other signs of stroke may include:  A sudden, severe headache with no known cause.  Nausea or  vomiting.  Seizure.  These symptoms may represent a serious problem that is an emergency. Do not wait to see if the symptoms will go away. Get medical help right away. Call your local emergency services (911 in the [place] Do not drive yourself to the hospital. Make note of the time that you had your first symptoms. Your emergency responders or emergency room staff will need to know this information. Summary  Being discharged from the hospital after a stroke can feel overwhelming. It is normal to feel scared or anxious.  Make sure  you take medicines exactly as told by your health care provider.  Know the warning signs of a stroke, and get help right way if you have any of these symptoms. "BE FAST" is an easy way to remember the main warning signs of a stroke. This information is not intended to replace advice given to you by your health care provider. Make sure you discuss any questions you have with your health care provider. Document Released: 03/01/2017 Document Revised: 03/01/2017 Document Reviewed: 03/01/2017 Elsevier Interactive Patient Education  2018 [employer].   Stroke Prevention Some health problems and behaviors may make it more likely for you to have a stroke. Below are ways to lessen your risk of having a stroke.  Be active for at least 30 minutes on most or all days.  Do not smoke. Try not to be around others who smoke.  Do not drink too much alcohol. ? Do not have more than 2 drinks a day if you are a man. ? Do not have more than 1 drink a day if you are a woman and are not pregnant.  Eat healthy foods, such as fruits and vegetables. If you were put on a specific diet, follow the diet as told.  Keep your cholesterol levels under control through diet and medicines. Look for foods that are low in saturated fat, trans fat, cholesterol, and are high in fiber.  If you have diabetes, follow all diet plans and take your medicine as told.  Ask your doctor if you need  treatment to lower your blood pressure. If you have high blood pressure (hypertension), follow all diet plans and take your medicine as told by your doctor.  If you are 54-81 years old, have your blood pressure checked every 3-5 years. If you are age 28 or older, have your blood pressure checked every year.  Keep a healthy weight. Eat foods that are low in calories, salt, saturated fat, trans fat, and cholesterol.  Do not take drugs.  Avoid birth control pills, if this applies. Talk to your doctor about the risks of taking birth control pills.  Talk to your doctor if you have sleep problems (sleep apnea).  Take all medicine as told by your doctor. ? You may be told to take aspirin or blood thinner medicine. Take this medicine as told by your doctor. ? Understand your medicine instructions.  Make sure any other conditions you have are being taken care of.  Get help right away if:  You suddenly lose feeling (you feel numb) or have weakness in your face, arm, or leg.  Your face or eyelid hangs down to one side.  You suddenly feel confused.  You have trouble talking (aphasia) or understanding what people are saying.  You suddenly have trouble seeing in one or both eyes.  You suddenly have trouble walking.  You are dizzy.  You lose your balance or your movements are clumsy (uncoordinated).  You suddenly have a very bad headache and you do not know the cause.  You have new chest pain.  Your heart feels like it is fluttering or skipping a beat (irregular heartbeat). Do not wait to see if the symptoms above go away. Get help right away. Call your local emergency services (911 in [place] Do not drive yourself to the hospital. This information is not intended to replace advice given to you by your health care provider. Make sure you discuss any questions you have with your health care provider. Document Released: 05/27/2012 Document  Revised: 05/03/2016 Document Reviewed:  05/29/2013 Elsevier Interactive Patient Education  [employer].

## 2018-02-14 NOTE — Progress Notes (Signed)
Patient for discharge to [facility]. Report called to [facility] using [facility]. All questions were answered.

## 2018-02-14 NOTE — Discharge Summary (Signed)
Discharge Summary  MERTHA CLYATT ATF:[id number] DOB: 02/20/1929  PCP: System, Pcp Not In  Admit date: 02/11/2018 Discharge date: 02/14/2018  Time spent: 25 minutes  Recommendations for Outpatient Follow-up:  1. Follow up with Neurology 2. Follow up with PCP 3. Take your medications as prescribed 4. Fall precaution  Discharge Diagnoses:  Active Hospital Problems   Diagnosis Date Noted  . Ischemic stroke ([place])   . Depression 02/11/2018  . CKD (chronic kidney disease), stage III ([place]) 02/11/2018  . Dementia 06/25/2017  . Essential hypertension   . HLD (hyperlipidemia)     Resolved Hospital Problems  No resolved problems to display.    Discharge Condition: stable  Diet recommendation: resume previous diet  Vitals:   02/14/18 0843 02/14/18 1237  BP: (!) 139/56 128/60  Pulse: 74 80  Resp: 18 18  Temp: 98.1 F (36.7 C) 98.9 F (37.2 C)  SpO2: 97% 98%    History of present illness:  Naziah Portee Cravenis a 84 y.o.femalewith medical history significant ofstroke,hypertension, hyperlipidemia, depression, dementia, CK-3, who presents with aphasia.  Admitted for stroke workup.  MRI revealed left lateral temporal lobe infarction.  Neurology consulted and following.  02/12/2018: Patient seen and examined at her bedside.  Reports severe headache.  Headache cocktail was given with improvement of symptoms.  Denies change in vision or nausea.  Patient is alert but confused and has expressive aphasia.   She was assessed by PT who recommended HHPT and speech therapy recommended to continue with speech training.  On the day of discharge the patient was hemodynamically stable. Spoke with the patient's daughter Di Kindle on the phone who requests and is adamant about the patient returning to assisted living even after being explained the risks of falling while living alone post stroke.   Hospital Course:  Principal Problem:   Ischemic stroke ([place]) Active Problems:   HLD  (hyperlipidemia)   Essential hypertension   Dementia   Depression   CKD (chronic kidney disease), stage III ([facility])  Acute CVA left lateral temporal lobe -Neurology following -Loop recorder recommended by neurology -Continue aspirin and Plavix per neuro -speech therapy recommended skilled nursing facility -PT recommended home health PT -OT recommended home health OT -LDL 79 goal 70 -A1c 5.8 -Fall precaution -Negative prior TEE and negative prior 30-day cardiac monitoring  Headache, severe, resolved -Headaches cocktail given with IV Toradol IV Benadryl and IV Reglan once -Improvement of symptoms post above treatment -no acute neurological changes  Hypertension -Blood pressure stable -Continue meds  Hyperlipidemia -Continue statin -LDL goal less than 70  Depression -Continue home meds Abilify Zoloft BuSpar  CKD 3, improving -Baseline creatinine 1.2 -Creatinine of 1.07 from 1.20   Procedures:  None   Consultations:  Neurology  Discharge Exam: BP 128/60 (BP Location: Left Arm)   Pulse 80   Temp 98.9 F (37.2 C) (Oral)   Resp 18   Ht 5\' 6"  (1.676 m)   Wt 77.1 kg (169 lb 15.6 oz)   SpO2 98%   BMI 27.43 kg/m   General: 84 yo CF WD WN NAD. Alert but minimally interactive. Cardiovascular: RRR no rubs or gallops Respiratory: CTA no rales or wheezes   Discharge Instructions You were cared for by a hospitalist during your hospital stay. If you have any questions about your discharge medications or the care you received while you were in the hospital after you are discharged, you can call the unit and asked to speak with the hospitalist on call if the hospitalist that took  care of you is not available. Once you are discharged, your primary care physician will handle any further medical issues. Please note that NO REFILLS for any discharge medications will be authorized once you are discharged, as it is imperative that you return to your primary care physician  (or establish a relationship with a primary care physician if you do not have one) for your aftercare needs so that they can reassess your need for medications and monitor your lab values.   Allergies as of 02/14/2018   No Known Allergies     Medication List    STOP taking these medications   oxyCODONE-acetaminophen 5-325 MG tablet Commonly known as:  PERCOCET/ROXICET     TAKE these medications   alendronate 70 MG tablet Commonly known as:  FOSAMAX Take 70 mg by mouth once a week. Take with a full glass of water on an empty stomach.   ARIPiprazole 2 MG tablet Commonly known as:  ABILIFY Take 1 mg by mouth daily.   aspirin 81 MG EC tablet Take 1 tablet (81 mg total) by mouth daily.   atorvastatin 80 MG tablet Commonly known as:  LIPITOR Take 1 tablet (80 mg total) by mouth daily. What changed:    medication strength  how much to take   busPIRone 30 MG tablet Commonly known as:  BUSPAR Take 30 mg by mouth 2 (two) times daily.   CALCIUM 600-D 600-400 MG-UNIT Tabs Generic drug:  Calcium Carbonate-Vitamin D3 Take 1 tablet by mouth 2 (two) times daily.   calcium-vitamin D 500-200 MG-UNIT tablet Commonly known as:  OSCAL WITH D Take 1 tablet by mouth 2 (two) times daily.   clopidogrel 75 MG tablet Commonly known as:  PLAVIX Take 1 tablet (75 mg total) by mouth daily.   losartan 25 MG tablet Commonly known as:  COZAAR Take 12.5 mg by mouth daily.   Melatonin 3 MG Tabs Take 3 mg by mouth at bedtime.   senna-docusate 8.6-50 MG tablet Commonly known as:  Senokot-S Take 1 tablet by mouth at bedtime.   sertraline 100 MG tablet Commonly known as:  ZOLOFT Take 100 mg by mouth daily.      No Known Allergies Follow-up Information    Garvin Fila, MD Follow up in 6 week(s).   Specialties:  Neurology, Radiology Contact information: [address] [place] [address] [place] Follow up in 6 week(s).    Contact information: [address]     [place] [address] [phone]           The results of significant diagnostics from this hospitalization (including imaging, microbiology, ancillary and laboratory) are listed below for reference.    Significant Diagnostic Studies: Ct Angio Head W Or Wo Contrast  Result Date: 02/11/2018 CLINICAL DATA:  84 y/o  F; aphasia, no focal weakness. EXAM: CT ANGIOGRAPHY HEAD AND NECK CT PERFUSION BRAIN TECHNIQUE: Multidetector CT imaging of the head and neck was performed using the standard protocol during bolus administration of intravenous contrast. Multiplanar CT image reconstructions and MIPs were obtained to evaluate the vascular anatomy. Carotid stenosis measurements (when applicable) are obtained utilizing NASCET criteria, using the distal internal carotid diameter as the denominator. Multiphase CT imaging of the brain was performed following IV bolus contrast injection. Subsequent parametric perfusion maps were calculated using RAPID software. CONTRAST:  69mL ISOVUE-370 IOPAMIDOL (ISOVUE-370) INJECTION 76% COMPARISON:  02/11/2018 CT head. FINDINGS: CTA NECK FINDINGS  Aortic arch: Standard branching. Imaged portion shows no evidence of aneurysm or dissection. No significant stenosis of the major arch vessel origins. Mild calcific atherosclerosis. Right carotid system: No evidence of dissection, stenosis (50% or greater) or occlusion. Left carotid system: No evidence of dissection, stenosis (50% or greater) or occlusion. Vertebral arteries: Left dominant. No evidence of dissection, stenosis (50% or greater) or occlusion. Skeleton: Moderate cervical spondylosis with multilevel disc and facet degenerative changes. Canal stenosis is greatest at the C4 through C6 levels where it is at least moderate. Other neck: Negative. Upper chest: Mild biapical pleuroparenchymal scarring. Review of the MIP images confirms the above findings CTA HEAD  FINDINGS Anterior circulation: No significant stenosis, proximal occlusion, aneurysm, or vascular malformation. Non stenotic calcific atherosclerosis of carotid siphons. Posterior circulation: No significant stenosis, proximal occlusion, aneurysm, or vascular malformation. Diminutive left PCA with chronic left posterior PCA territory infarction. Multiple segments of mild stenosis and right PCA. Venous sinuses: As permitted by contrast timing, patent. Anatomic variants: Patent circle-of-Willis. Review of the MIP images confirms the above findings CT Brain Perfusion Findings: CBF (<30%) Volume: 98mL Perfusion (Tmax>6.0s) volume: 38mL Mismatch Volume: 51mL Infarction Location:Negative. IMPRESSION: 1. Patent carotid and vertebral arteries. No dissection, aneurysm, or hemodynamically significant stenosis utilizing NASCET criteria. 2. Patent anterior and posterior intracranial circulation. No large vessel occlusion, aneurysm, or significant stenosis. 3. Negative CT perfusion head. These results were called by telephone at the time of interpretation on 02/11/2018 at 8:24 pm to Dr. Amie Portland , who verbally acknowledged these results. Electronically Signed   By: Kristine Garbe M.D.   On: 02/11/2018 20:27   Ct Angio Neck W Or Wo Contrast  Result Date: 02/11/2018 CLINICAL DATA:  84 y/o  F; aphasia, no focal weakness. EXAM: CT ANGIOGRAPHY HEAD AND NECK CT PERFUSION BRAIN TECHNIQUE: Multidetector CT imaging of the head and neck was performed using the standard protocol during bolus administration of intravenous contrast. Multiplanar CT image reconstructions and MIPs were obtained to evaluate the vascular anatomy. Carotid stenosis measurements (when applicable) are obtained utilizing NASCET criteria, using the distal internal carotid diameter as the denominator. Multiphase CT imaging of the brain was performed following IV bolus contrast injection. Subsequent parametric perfusion maps were calculated using RAPID  software. CONTRAST:  61mL ISOVUE-370 IOPAMIDOL (ISOVUE-370) INJECTION 76% COMPARISON:  02/11/2018 CT head. FINDINGS: CTA NECK FINDINGS Aortic arch: Standard branching. Imaged portion shows no evidence of aneurysm or dissection. No significant stenosis of the major arch vessel origins. Mild calcific atherosclerosis. Right carotid system: No evidence of dissection, stenosis (50% or greater) or occlusion. Left carotid system: No evidence of dissection, stenosis (50% or greater) or occlusion. Vertebral arteries: Left dominant. No evidence of dissection, stenosis (50% or greater) or occlusion. Skeleton: Moderate cervical spondylosis with multilevel disc and facet degenerative changes. Canal stenosis is greatest at the C4 through C6 levels where it is at least moderate. Other neck: Negative. Upper chest: Mild biapical pleuroparenchymal scarring. Review of the MIP images confirms the above findings CTA HEAD FINDINGS Anterior circulation: No significant stenosis, proximal occlusion, aneurysm, or vascular malformation. Non stenotic calcific atherosclerosis of carotid siphons. Posterior circulation: No significant stenosis, proximal occlusion, aneurysm, or vascular malformation. Diminutive left PCA with chronic left posterior PCA territory infarction. Multiple segments of mild stenosis and right PCA. Venous sinuses: As permitted by contrast timing, patent. Anatomic variants: Patent circle-of-Willis. Review of the MIP images confirms the above findings CT Brain Perfusion Findings: CBF (<30%) Volume: 28mL Perfusion (Tmax>6.0s) volume: 24mL Mismatch Volume: 77mL Infarction Location:Negative.  IMPRESSION: 1. Patent carotid and vertebral arteries. No dissection, aneurysm, or hemodynamically significant stenosis utilizing NASCET criteria. 2. Patent anterior and posterior intracranial circulation. No large vessel occlusion, aneurysm, or significant stenosis. 3. Negative CT perfusion head. These results were called by telephone at the  time of interpretation on 02/11/2018 at 8:24 pm to Dr. Amie Portland , who verbally acknowledged these results. Electronically Signed   By: Kristine Garbe M.D.   On: 02/11/2018 20:27   Mr Brain Wo Contrast  Result Date: 02/11/2018 CLINICAL DATA:  84 y/o  F; code stroke, limited study. EXAM: MRI HEAD WITHOUT CONTRAST TECHNIQUE: Axial DWI, coronal DWI, axial T2 FLAIR, axial SWAN sequences were acquired. COMPARISON:  02/11/2018 CT.  11/02/2015 MRI of the head. FINDINGS: Brain: Left lateral temporal lobe region of mildly reduced diffusion measuring 5.4 x 2.6 x 2.0 cm (volume = 15 cm^3) corresponding to hypoattenuation on CT head. The region of reduced diffusion demonstrates T2 FLAIR hyperintensity and mild local mass effect. Findings are compatible with a subacute infarction. No additional reduced diffusion to suggest acute or early subacute infarction. No abnormal susceptibility hypointensity to indicate acute hemorrhage. Chronic infarcts are present within the right parietal lobe, bilateral occipital lobes, and right inferior cerebellar hemisphere. There is a background of mild chronic microvascular ischemic changes and parenchymal volume loss of the brain. No extra-axial collection or effacement of basilar cisterns. Vascular: Normal flow voids. Skull and upper cervical spine: No abnormal diffusion, FLAIR, or SWI signal. Sinuses/Orbits: Negative. Other: None. IMPRESSION: 1. Subacute left lateral temporal lobe infarction, approximately 15 cc. Mild local mass effect. No hemorrhage. 2. No additional acute or early subacute infarction of the brain. 3. Multiple additional stable chronic infarcts, chronic microvascular ischemic changes, parenchymal volume loss. Electronically Signed   By: Kristine Garbe M.D.   On: 02/11/2018 21:00   Ct Cerebral Perfusion W Contrast  Result Date: 02/11/2018 CLINICAL DATA:  84 y/o  F; aphasia, no focal weakness. EXAM: CT ANGIOGRAPHY HEAD AND NECK CT PERFUSION BRAIN  TECHNIQUE: Multidetector CT imaging of the head and neck was performed using the standard protocol during bolus administration of intravenous contrast. Multiplanar CT image reconstructions and MIPs were obtained to evaluate the vascular anatomy. Carotid stenosis measurements (when applicable) are obtained utilizing NASCET criteria, using the distal internal carotid diameter as the denominator. Multiphase CT imaging of the brain was performed following IV bolus contrast injection. Subsequent parametric perfusion maps were calculated using RAPID software. CONTRAST:  15mL ISOVUE-370 IOPAMIDOL (ISOVUE-370) INJECTION 76% COMPARISON:  02/11/2018 CT head. FINDINGS: CTA NECK FINDINGS Aortic arch: Standard branching. Imaged portion shows no evidence of aneurysm or dissection. No significant stenosis of the major arch vessel origins. Mild calcific atherosclerosis. Right carotid system: No evidence of dissection, stenosis (50% or greater) or occlusion. Left carotid system: No evidence of dissection, stenosis (50% or greater) or occlusion. Vertebral arteries: Left dominant. No evidence of dissection, stenosis (50% or greater) or occlusion. Skeleton: Moderate cervical spondylosis with multilevel disc and facet degenerative changes. Canal stenosis is greatest at the C4 through C6 levels where it is at least moderate. Other neck: Negative. Upper chest: Mild biapical pleuroparenchymal scarring. Review of the MIP images confirms the above findings CTA HEAD FINDINGS Anterior circulation: No significant stenosis, proximal occlusion, aneurysm, or vascular malformation. Non stenotic calcific atherosclerosis of carotid siphons. Posterior circulation: No significant stenosis, proximal occlusion, aneurysm, or vascular malformation. Diminutive left PCA with chronic left posterior PCA territory infarction. Multiple segments of mild stenosis and right PCA. Venous sinuses: As permitted  by contrast timing, patent. Anatomic variants: Patent  circle-of-Willis. Review of the MIP images confirms the above findings CT Brain Perfusion Findings: CBF (<30%) Volume: 43mL Perfusion (Tmax>6.0s) volume: 53mL Mismatch Volume: 73mL Infarction Location:Negative. IMPRESSION: 1. Patent carotid and vertebral arteries. No dissection, aneurysm, or hemodynamically significant stenosis utilizing NASCET criteria. 2. Patent anterior and posterior intracranial circulation. No large vessel occlusion, aneurysm, or significant stenosis. 3. Negative CT perfusion head. These results were called by telephone at the time of interpretation on 02/11/2018 at 8:24 pm to Dr. Amie Portland , who verbally acknowledged these results. Electronically Signed   By: Kristine Garbe M.D.   On: 02/11/2018 20:27   Ct Head Code Stroke Wo Contrast  Result Date: 02/11/2018 CLINICAL DATA:  Code stroke.  84 y/o  F; aphasia, no focal weakness. EXAM: CT HEAD WITHOUT CONTRAST TECHNIQUE: Contiguous axial images were obtained from the base of the skull through the vertex without intravenous contrast. COMPARISON:  06/25/2017 CT head FINDINGS: Brain: Left lateral temporal lobe and temporal operculum region of hypoattenuation likely representing late acute to subacute infarction. No hemorrhage or mass effect identified. Stable chronic infarcts in right parietal as well as bilateral occipital lobes and the right inferior cerebellar hemisphere. Stable chronic microvascular ischemic changes and parenchymal volume loss of the brain. Vascular: Calcific atherosclerosis of carotid siphons. No hyperdense vessel identified. Skull: Normal. Negative for fracture or focal lesion. Sinuses/Orbits: No acute finding. Other: None. ASPECTS [facility] Stroke Program Early CT Score) - Ganglionic level infarction (caudate, lentiform nuclei, internal capsule, insula, M1-M3 cortex): 5 - Supraganglionic infarction (M4-M6 cortex): 3 Total score (0-10 with 10 being normal): IMPRESSION: 1. Left lateral temporal lobe region of  hypoattenuation likely representing late acute to subacute infarction. No hemorrhage or mass effect. 2. ASPECTS is 8 3. Multiple stable chronic infarctions, chronic microvascular ischemic changes, and parenchymal volume loss of the brain are otherwise stable. These results were communicated to Dr. Rory Percy at 8:05 pmon 3/5/2019by text page via the [facility] messaging system. Electronically Signed   By: Kristine Garbe M.D.   On: 02/11/2018 20:05    Microbiology: Recent Results (from the past 240 hour(s))  MRSA PCR Screening     Status: None   Collection Time: 02/12/18 12:40 AM  Result Value Ref Range Status   MRSA by PCR NEGATIVE NEGATIVE Final    Comment:        The GeneXpert MRSA Assay (FDA approved for NASAL specimens only), is one component of a comprehensive MRSA colonization surveillance program. It is not intended to diagnose MRSA infection nor to guide or monitor treatment for MRSA infections. Performed at [facility], [place] [address], [place], [place] [address]      Labs: Basic Metabolic Panel: Recent Labs  Lab 02/11/18 1951 02/11/18 1956 02/13/18 1038  NA 140 143 140  K 4.1 4.1 4.0  CL 112* 112* 114*  CO2 19*  --  18*  GLUCOSE 177* 172* 146*  BUN 25* 28* 14  CREATININE 1.18* 1.20* 1.07*  CALCIUM 8.6*  --  8.1*   Liver Function Tests: Recent Labs  Lab 02/11/18 1951  AST 21  ALT 17  ALKPHOS 77  BILITOT 0.4  PROT 6.2*  ALBUMIN 3.7   No results for input(s): LIPASE, AMYLASE in the last 168 hours. No results for input(s): AMMONIA in the last 168 hours. CBC: Recent Labs  Lab 02/11/18 1951 02/11/18 1956 02/13/18 1038  WBC 7.9  --  6.5  NEUTROABS 4.9  --   --  HGB 11.3* 10.9* 11.2*  HCT 35.9* 32.0* 33.7*  MCV 94.2  --  92.3  PLT 176  --  158   Cardiac Enzymes: No results for input(s): CKTOTAL, CKMB, CKMBINDEX, TROPONINI in the last 168 hours. BNP: BNP (last 3 results) No results for input(s): BNP in the last 8760 hours.  ProBNP (last 3  results) No results for input(s): PROBNP in the last 8760 hours.  CBG: Recent Labs  Lab 02/11/18 1948  GLUCAP 162*       Signed:  Kayleen Memos, MD [facility] 02/14/2018, 2:11 PM

## 2018-02-14 NOTE — Progress Notes (Signed)
Occupational Therapy Treatment Patient Details Name: Nancy Rowland MRN: [id number] DOB: January 05, 1929 Today's Date: 02/14/2018    History of present illness 84 y.o. female with medical history significant of stroke, hypertension, hyperlipidemia, depression, dementia, CK-3, who presents with aphasiaMRI Subacute left lateral temporal lobe infarction.   OT comments  Pt making progress with functional goals. Pt's daughter concerned that MD has recommended SNF placement for pt and states that she would still like her mother to return to ALF with 24 hour sup and HH and possiblt hiring additrional help for more direct supervision. OT will continue to follow acutely  Follow Up Recommendations  Home health OT;Supervision/Assistance - 24 hour    Equipment Recommendations  None recommended by OT    Recommendations for Other Services      Precautions / Restrictions Precautions Precautions: Fall Restrictions Weight Bearing Restrictions: No       Mobility Bed Mobility               General bed mobility comments: pt OOB in recliner upon OT arrival  Transfers Overall transfer level: Needs assistance Equipment used: Rolling walker (2 wheeled) Transfers: Sit to/from Stand Sit to Stand: Min assist         General transfer comment: min guard A for steadying with RW,correct sequencing and safe hand placement    Balance Overall balance assessment: Needs assistance Sitting-balance support: No upper extremity supported;Feet supported Sitting balance-Leahy Scale: Fair     Standing balance support: Bilateral upper extremity supported;Single extremity supported;During functional activity Standing balance-Leahy Scale: Fair                             ADL either performed or assessed with clinical judgement   ADL Overall ADL's : Needs assistance/impaired     Grooming: Standing;Min guard;Wash/dry hands;Wash/dry face;Brushing hair Grooming Details (indicate cue type and  reason): visual and verbal cues to initiate                 Toilet Transfer: Minimal assistance;RW;Ambulation;Comfort height toilet;Grab bars Toilet Transfer Details (indicate cue type and reason): cues for correct/safe hand placement Toileting- Clothing Manipulation and Hygiene: Moderate assistance;Sit to/from stand       Functional mobility during ADLs: Minimal assistance;Rolling walker;Cueing for safety       Vision Patient Visual Report: No change from baseline     Perception     Praxis      Cognition Arousal/Alertness: Awake/alert Behavior During Therapy: Anxious Overall Cognitive Status: History of cognitive impairments - at baseline                                 General Comments: Pt daughter reports baseline dementia        Exercises     Shoulder Instructions       General Comments      Pertinent Vitals/ Pain       Pain Assessment: No/denies pain Pain Score: 0-No pain Pain Intervention(s): Monitored during session  Home Living                                          Prior Functioning/Environment              Frequency  Min 2X/week        Progress Toward Goals  OT Goals(current goals can now be found in the care plan section)  Progress towards OT goals: Progressing toward goals  Acute Rehab OT Goals Patient Stated Goal: per daughter - to get better  Plan Discharge plan remains appropriate    Co-evaluation                 AM-PAC PT "6 Clicks" Daily Activity     Outcome Measure   Help from another person eating meals?: A Little Help from another person taking care of personal grooming?: A Little Help from another person toileting, which includes using toliet, bedpan, or urinal?: A Little Help from another person bathing (including washing, rinsing, drying)?: A Little Help from another person to put on and taking off regular upper body clothing?: A Little Help from another person to put  on and taking off regular lower body clothing?: A Lot 6 Click Score: 17    End of Session Equipment Utilized During Treatment: Gait belt;Rolling walker  OT Visit Diagnosis: Unsteadiness on feet (R26.81);Other symptoms and signs involving cognitive function;Cognitive communication deficit (R41.841) Symptoms and signs involving cognitive functions: Cerebral infarction   Activity Tolerance Patient tolerated treatment well   Patient Left in chair;with call bell/phone within reach;with chair alarm set;with family/visitor present   Nurse Communication      Functional Assessment Tool Used: AM-PAC 6 Clicks Daily Activity   Time: 1250-1315 OT Time Calculation (min): 25 min  Charges: OT G-codes **NOT FOR INPATIENT CLASS** Functional Assessment Tool Used: AM-PAC 6 Clicks Daily Activity OT General Charges $OT Visit: 1 Visit OT Treatments $Self Care/Home Management : 8-22 mins $Therapeutic Activity: 8-22 mins     Britt Bottom 02/14/2018, 1:49 PM

## 2018-02-14 NOTE — NC FL2 (Addendum)
[place] LEVEL OF CARE SCREENING TOOL     IDENTIFICATION  Patient Name: Nancy Rowland Birthdate: 28-Jan-1929 Sex: female Admission Date (Current Location): 02/11/2018  [facility] [place] Number:  [employer] and Address:  [facility], [place] [address], [place], [place] [address]      Provider Number: [id number]  Attending Physician Name and Address:  Kayleen Memos, DO  Relative Name and Phone Number:       Current Level of Care: Hospital Recommended Level of Care: [place] Prior Approval Number:    Date Approved/Denied:   PASRR Number:    Discharge Plan: Other (Comment)(ALF)    Current Diagnoses: Patient Active Problem List   Diagnosis Date Noted  . Depression 02/11/2018  . CKD (chronic kidney disease), stage III ([place]) 02/11/2018  . Nondisplaced comminuted fracture of left patella, initial encounter for closed fracture 07/15/2017  . Pyuria 06/26/2017  . Dementia 06/25/2017  . Fall 06/25/2017  . Closed fracture of left distal radius 05/27/2017  . Ischemic stroke ([place])   . History of stroke   . HLD (hyperlipidemia)   . Essential hypertension   . TIA (transient ischemic attack) 11/02/2015    Orientation RESPIRATION BLADDER Height & Weight     Self  Normal Continent Weight: 169 lb 15.6 oz (77.1 kg) Height:  5\' 6"  (167.6 cm)  BEHAVIORAL SYMPTOMS/MOOD NEUROLOGICAL BOWEL NUTRITION STATUS      Continent Diet(regular)  AMBULATORY STATUS COMMUNICATION OF NEEDS Skin   Extensive Assist Non-Verbally Normal                       Personal Care Assistance Level of Assistance  Bathing, Feeding, Dressing Bathing Assistance: Limited assistance Feeding assistance: Independent Dressing Assistance: Limited assistance     Functional Limitations Info  Sight, Hearing, Speech Sight Info: Adequate Hearing Info: Adequate Speech Info: Impaired    SPECIAL CARE FACTORS FREQUENCY  PT (By licensed PT), OT (By  licensed OT), Speech therapy     PT Frequency: 3x/wk with home health OT Frequency: 3s/wk with home health  Speech Frequency: 3x/wk with home health          Contractures      Additional Factors Info  Code Status, Allergies, Psychotropic Code Status Info: DNR Allergies Info: NKA Psychotropic Info: Abilify 1mg  daily; Buspar 30mg  2x/day; Zoloft 100mg  daily         Current Medications (02/14/2018):  This is the current hospital active medication list Current Facility-Administered Medications  Medication Dose Route Frequency Provider Last Rate Last Dose  . 0.9 %  sodium chloride infusion   Intravenous Continuous Ivor Costa, MD 75 mL/hr at 02/14/18 0000    . acetaminophen (TYLENOL) tablet 650 mg  650 mg Oral Q4H PRN Ivor Costa, MD   650 mg at 02/14/18 0534   Or  . acetaminophen (TYLENOL) solution 650 mg  650 mg Per Tube Q4H PRN Ivor Costa, MD       Or  . acetaminophen (TYLENOL) suppository 650 mg  650 mg Rectal Q4H PRN Ivor Costa, MD      . ARIPiprazole (ABILIFY) tablet 1 mg  1 mg Oral Daily Ivor Costa, MD   1 mg at 02/13/18 0836  . aspirin EC tablet 81 mg  81 mg Oral Daily Donzetta Starch, NP   81 mg at 02/13/18 0836  . atorvastatin (LIPITOR) tablet 80 mg  80 mg Oral Daily Ivor Costa, MD   80  mg at 02/13/18 2050  . busPIRone (BUSPAR) tablet 30 mg  30 mg Oral BID Ivor Costa, MD   30 mg at 02/13/18 2151  . calcium-vitamin D (OSCAL WITH D) 500-200 MG-UNIT per tablet 1 tablet  1 tablet Oral BID Ivor Costa, MD   1 tablet at 02/13/18 2151  . clopidogrel (PLAVIX) tablet 75 mg  75 mg Oral Daily Ivor Costa, MD   75 mg at 02/13/18 0836  . enoxaparin (LOVENOX) injection 40 mg  40 mg Subcutaneous Q24H Ivor Costa, MD   40 mg at 02/13/18 1651  . hydrALAZINE (APRESOLINE) injection 5 mg  5 mg Intravenous Q2H PRN Ivor Costa, MD      . Melatonin TABS 3 mg  3 mg Oral QHS Ivor Costa, MD   3 mg at 02/13/18 2151  . ondansetron (ZOFRAN) injection 4 mg  4 mg Intravenous Q8H PRN Ivor Costa, MD      .  oxyCODONE-acetaminophen (PERCOCET/ROXICET) 5-325 MG per tablet 0.5 tablet  0.5 tablet Oral Q6H PRN Ivor Costa, MD      . senna-docusate (Senokot-S) tablet 1 tablet  1 tablet Oral QHS Ivor Costa, MD   1 tablet at 02/13/18 2151  . sertraline (ZOLOFT) tablet 100 mg  100 mg Oral Daily Ivor Costa, MD   100 mg at 02/13/18 0836  . zolpidem (AMBIEN) tablet 5 mg  5 mg Oral QHS PRN Ivor Costa, MD         Discharge Medications: Please see discharge summary for a list of discharge medications.  Relevant Imaging Results:  Relevant Lab Results:   Additional Information SS#: [id number]  Geralynn Ochs, LCSW

## 2018-02-14 NOTE — Progress Notes (Signed)
  Speech Language Pathology Treatment: Cognitive-Linquistic  Patient Details Name: Nancy Rowland MRN: [id number] DOB: [birth year] Today's Date: 02/14/2018 Time: 1505-6979 SLP Time Calculation (min) (ACUTE ONLY): 28 min  Assessment / Plan / Recommendation Clinical Impression  Daughter, Nancy Rowland, present for session.  Pt able to follow three one-step commands in rotation with initial written cues faded to verbal instruction only with 90% accuracy.  Reads single words aloud.  Written comprehension is much better than verbal, providing an excellent method of cueing for pt.  Pt able to produce automatic sequences with initial modeling/production in unison faded to mouth movements only.  Nancy Rowland "take me out to the ball game" with phonemic paraphasias, but improved production with modeling/visual cues.  Discussed communication strategies with daughter.  Pt for likely D/C to SNF today.    HPI HPI: 84 year old female admitted 02/11/18 with aphasia. PMH significant for CVA (x3), HTN, HLD, depression, dementia, CK3. Orders received for speech/language evaluation      SLP Plan  Continue with current plan of care       Recommendations                   Follow up Recommendations: Skilled Nursing facility SLP Visit Diagnosis: Aphasia (R47.01) Plan: Continue with current plan of care       GO                Juan Quam Laurice 02/14/2018, 12:16 PM

## 2018-02-14 NOTE — Care Management Important Message (Signed)
Important Message  Patient Details  Name: Nancy Rowland MRN: [id number] Date of Birth: 1929-05-14   Medicare Important Message Given:  Yes    Earl Zellmer Montine Circle 02/14/2018, 12:41 PM

## 2018-02-14 NOTE — Progress Notes (Signed)
Physical Therapy Treatment Patient Details Name: Nancy Rowland MRN: [id number] DOB: 1929/12/09 Today's Date: 02/14/2018    History of Present Illness 84 y.o. female with medical history significant of stroke, hypertension, hyperlipidemia, depression, dementia, CK-3, who presents with aphasiaMRI Subacute left lateral temporal lobe infarction.    PT Comments    Pt making progress towards her goals today however is limited by her confusion and desire for her daughter to come. Pt agreed to get up to recliner to wait for her daughter. Pt is currently minA for bed mobility and min guard for transfers and ambulation of 12 feet to recliner. Due to pt's increased confusion in unfamiliar environment and good mobility PT continues to recommend returning to her ALF with 24 hour supervision and HHPT. PT will continue to follow acutely until d/c.    Follow Up Recommendations  Home health PT;Supervision/Assistance - 24 hour     Equipment Recommendations  None recommended by PT    Recommendations for Other Services       Precautions / Restrictions Precautions Precautions: Fall Restrictions Weight Bearing Restrictions: No    Mobility  Bed Mobility Overal bed mobility: Needs Assistance Bed Mobility: Supine to Sit     Supine to sit: Min assist;HOB elevated     General bed mobility comments: minA to move LE to floor  Transfers Overall transfer level: Needs assistance Equipment used: Rolling walker (2 wheeled) Transfers: Sit to/from Stand Sit to Stand: Min guard         General transfer comment: min guard for safety with steadyin in RW  Ambulation/Gait Ambulation/Gait assistance: Min guard Ambulation Distance (Feet): 15 Feet Assistive device: Rolling walker (2 wheeled) Gait Pattern/deviations: Step-through pattern;Decreased step length - right;Decreased step length - left;Shuffle Gait velocity: slowed Gait velocity interpretation: Below normal speed for age/gender General Gait  Details: min guard for safety, vc for proximity to RW,       Balance Overall balance assessment: Needs assistance Sitting-balance support: No upper extremity supported;Feet supported Sitting balance-Leahy Scale: Fair     Standing balance support: Bilateral upper extremity supported Standing balance-Leahy Scale: Poor                              Cognition Arousal/Alertness: Awake/alert Behavior During Therapy: Anxious Overall Cognitive Status: History of cognitive impairments - at baseline                                 General Comments: Pt daughter reports baseline dementia         General Comments General comments (skin integrity, edema, etc.): Pt daughter not present during session, pt repeatedly asking first he sister, and then her daughter      Pertinent Vitals/Pain Pain Assessment: Faces Pain Score: 0-No pain Faces Pain Scale: Hurts a little bit Pain Location: L wrist bruised  Pain Descriptors / Indicators: Discomfort Pain Intervention(s): Monitored during session;Limited activity within patient's tolerance;Repositioned           PT Goals (current goals can now be found in the care plan section) Acute Rehab PT Goals Patient Stated Goal: per daughter - to get better PT Goal Formulation: With patient/family Time For Goal Achievement: 02/26/18 Potential to Achieve Goals: Good Progress towards PT goals: Progressing toward goals    Frequency    Min 3X/week      PT Plan Current plan remains appropriate    Co-evaluation  AM-PAC PT "6 Clicks" Daily Activity  Outcome Measure  Difficulty turning over in bed (including adjusting bedclothes, sheets and blankets)?: A Little Difficulty moving from lying on back to sitting on the side of the bed? : Unable Difficulty sitting down on and standing up from a chair with arms (e.g., wheelchair, bedside commode, etc,.)?: Unable Help needed moving to and from a bed to chair  (including a wheelchair)?: A Little Help needed walking in hospital room?: A Little Help needed climbing 3-5 steps with a railing? : A Lot 6 Click Score: 13    End of Session Equipment Utilized During Treatment: Gait belt Activity Tolerance: Patient limited by fatigue Patient left: in chair;with call bell/phone within reach;with chair alarm set Nurse Communication: Mobility status PT Visit Diagnosis: Other abnormalities of gait and mobility (R26.89);Muscle weakness (generalized) (M62.81);Difficulty in walking, not elsewhere classified (R26.2)     Time: 8592-9244 PT Time Calculation (min) (ACUTE ONLY): 28 min  Charges:  $Gait Training: 8-22 mins $Therapeutic Activity: 8-22 mins                    G Codes:       Alainna Stawicki B. Migdalia Dk PT, DPT Acute Rehabilitation  [phone] Pager [phone]     [place] 02/14/2018, 2:44 PM

## 2018-05-27 IMAGING — CT CT HEAD W/O CM
3 of 4 series · 14 of 47 positions shown, 16 images · non-contrast
Comparison: January 31, 2017

CLINICAL DATA: Pain following fall.  History of dementia.

EXAM:
CT HEAD WITHOUT CONTRAST
TECHNIQUE: Contiguous axial images were obtained from the base of the skull
through the vertex without intravenous contrast.

[Series 2: head w/o · axial · non-contrast · 0.45mm/px · z∈[-140,-25]mm · 8 of 29 slices shown, 10 images]
[im 3/29  brain]
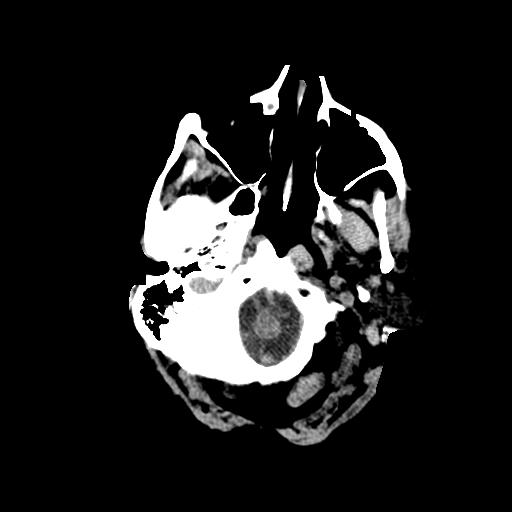
[im 3/29  bone]
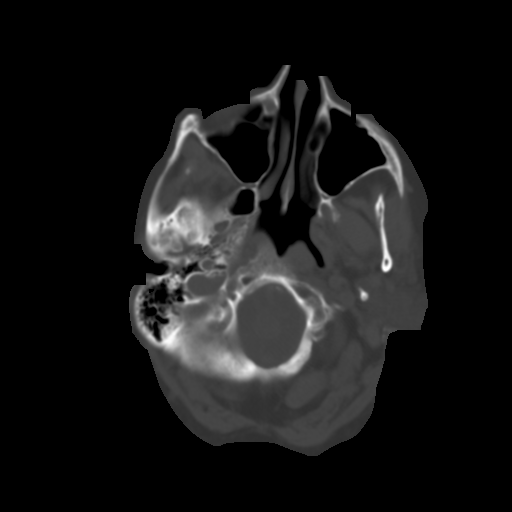
[im 6/29  brain]
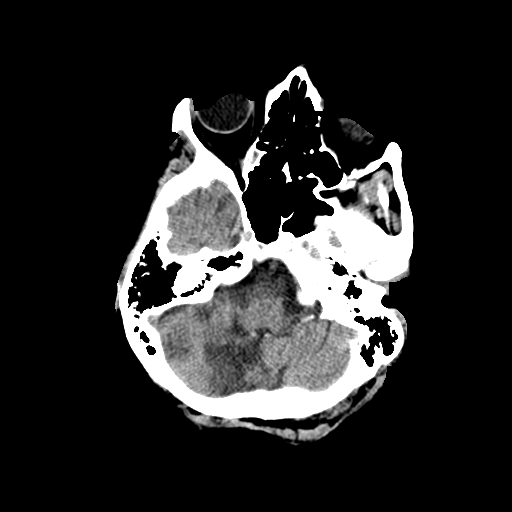
[im 9/29  brain]
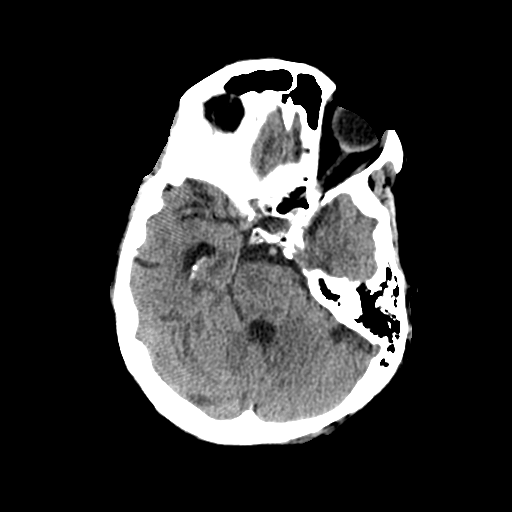
[im 12/29  brain]
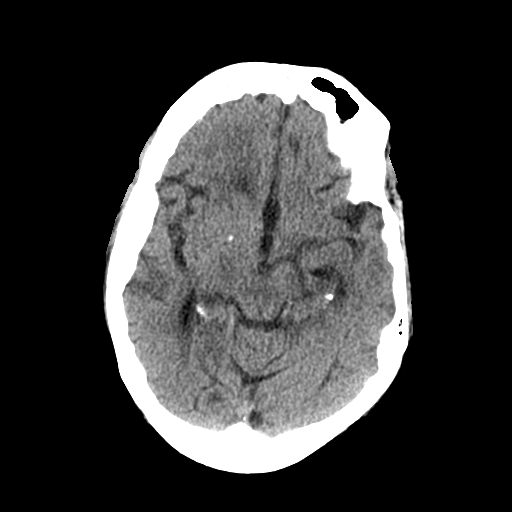
[im 17/29  brain]
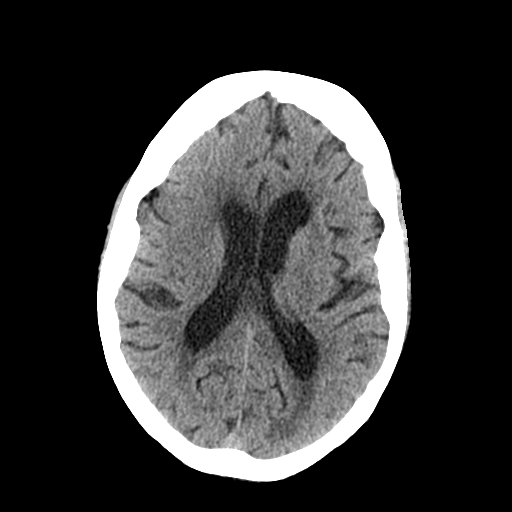
[im 17/29  bone]
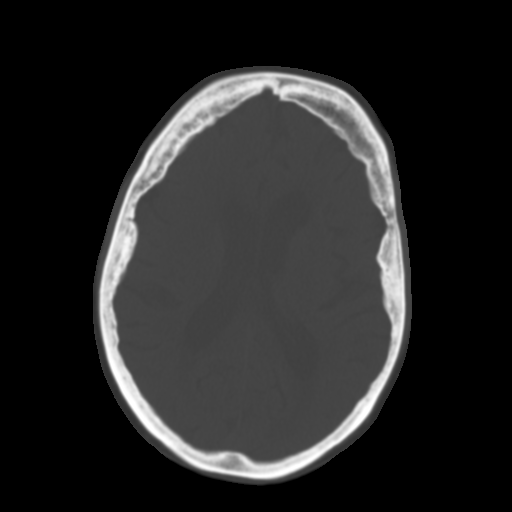
[im 20/29  brain]
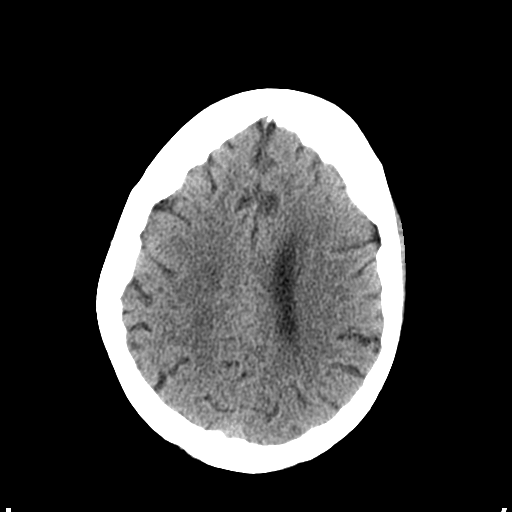
[im 23/29  brain]
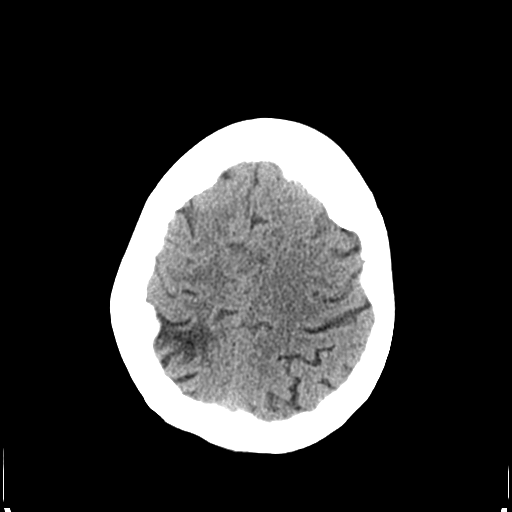
[im 26/29  brain]
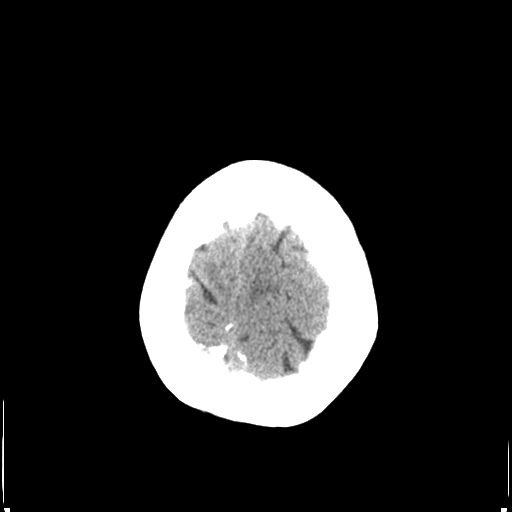

[Series 5: coronal · coronal · 0.29mm/px · 3 of 69 slices shown]
[im 23/69  brain]
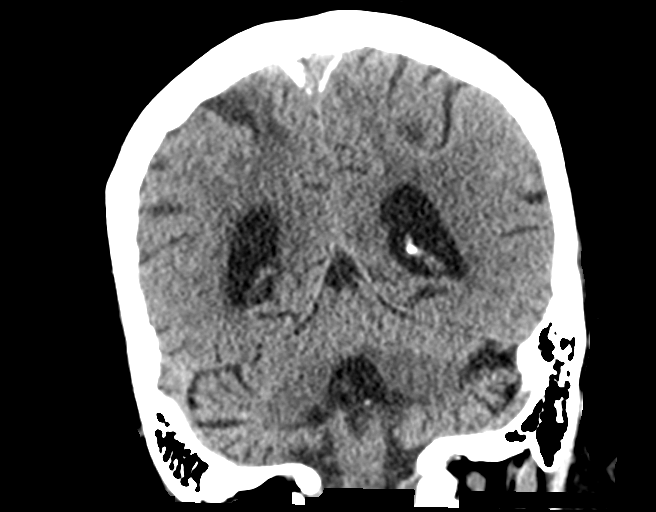
[im 31/69  brain]
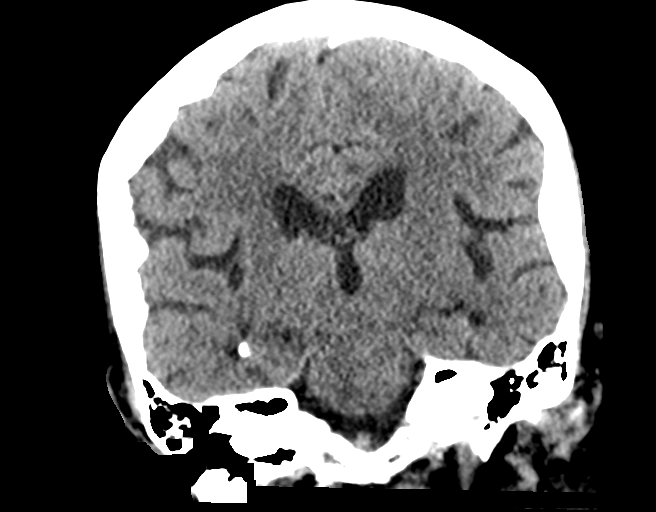
[im 38/69  brain]
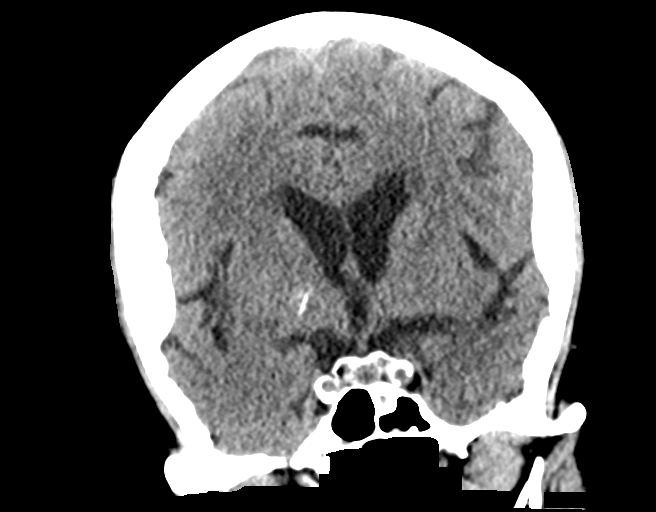

[Series 6: sagittal · sagittal · 0.29mm/px · 3 of 58 slices shown]
[im 20/58  brain]
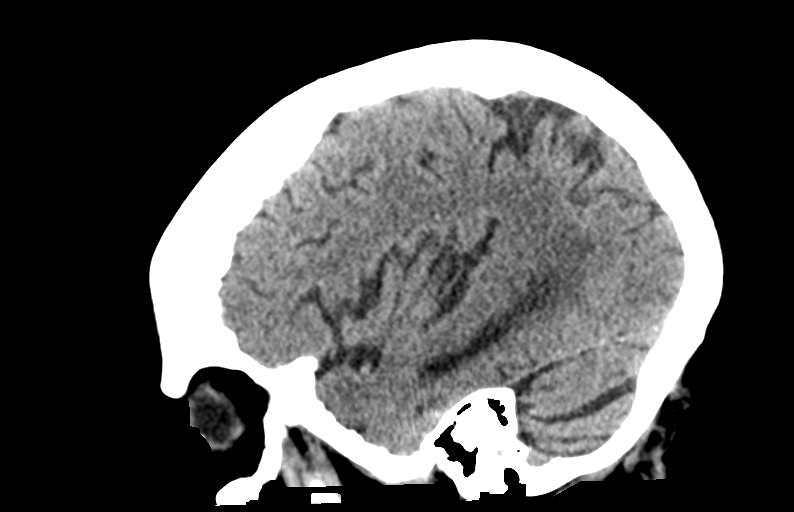
[im 29/58  brain]
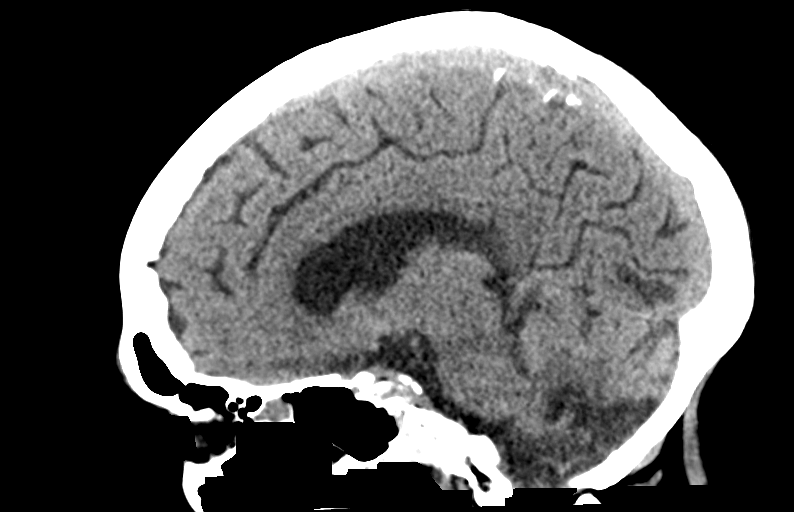
[im 39/58  brain]
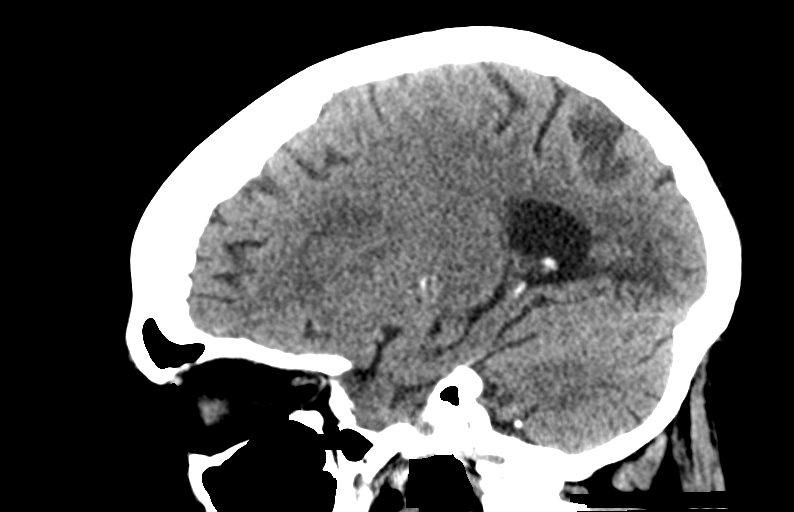

[14 of 47 positions shown; findings below may reference images not displayed]

FINDINGS: Brain: There is mild diffuse atrophy. There is no intracranial mass,
hemorrhage, extra-axial fluid collection, or midline shift. There is
decreased attenuation in the medial and lateral aspects of the
inferior right cerebellar hemisphere, stable. There is decreased
attenuation in the medial occipital lobe regions bilaterally, more
severe on the left than on the right, stable. There is decreased
attenuation in the mid superior right parietal lobe. These stable
areas of decreased attenuation are consistent with prior infarcts.
Elsewhere there is patchy small vessel disease in the centra
semiovale bilaterally. There is small vessel disease throughout the
anterior limb of the right internal capsule. No acute infarct is
demonstrable on this study. Mild basal ganglia calcification is
stable and felt to be physiologic in this age group.

Vascular: No evident hyperdense vessel. There is calcification in
each carotid siphon region as well as in the distal right vertebral
artery.

Skull: Bony calvarium appears intact.

Sinuses/Orbits: There is mucosal thickening in several ethmoid air
cells bilaterally with opacification in several posterior ethmoid
air cells bilaterally. Other visualized paranasal sinuses are clear.
Orbits appear symmetric bilaterally.

Other: Visualized mastoid air cells are clear.
IMPRESSION: Atrophy with prior infarcts in the posterior right cerebellum, each
posterior occipital lobe, in superior mid right parietal lobe. There
is patchy periventricular small vessel disease as well as small
vessel disease in the anterior limb of the right internal capsule.
No intracranial mass, hemorrhage, or extra-axial fluid collection.
No acute infarct. Multiple foci of arterial vascular calcification
noted. Foci of paranasal sinus disease in the ethmoid air cells
noted.

## 2018-12-08 ENCOUNTER — Emergency Department (HOSPITAL_COMMUNITY)
Admission: EM | Admit: 2018-12-08 | Discharge: 2018-12-08 | Disposition: A | Discharge status: Home / Self Care | Payer: Medicare Other | Attending: Emergency Medicine | Admitting: Emergency Medicine

## 2018-12-08 ENCOUNTER — Other Ambulatory Visit: Payer: Self-pay

## 2018-12-08 ENCOUNTER — Emergency Department (HOSPITAL_COMMUNITY): Payer: Medicare Other

## 2018-12-08 ENCOUNTER — Encounter (HOSPITAL_COMMUNITY): Payer: Self-pay | Admitting: *Deleted

## 2018-12-08 DIAGNOSIS — F0390 Unspecified dementia without behavioral disturbance: Secondary | ICD-10-CM | POA: Diagnosis not present

## 2018-12-08 DIAGNOSIS — W19XXXA Unspecified fall, initial encounter: Secondary | ICD-10-CM

## 2018-12-08 DIAGNOSIS — Y92129 Unspecified place in nursing home as the place of occurrence of the external cause: Secondary | ICD-10-CM | POA: Insufficient documentation

## 2018-12-08 DIAGNOSIS — Z8673 Personal history of transient ischemic attack (TIA), and cerebral infarction without residual deficits: Secondary | ICD-10-CM | POA: Diagnosis not present

## 2018-12-08 DIAGNOSIS — Y939 Activity, unspecified: Secondary | ICD-10-CM | POA: Insufficient documentation

## 2018-12-08 DIAGNOSIS — S0990XA Unspecified injury of head, initial encounter: Secondary | ICD-10-CM

## 2018-12-08 DIAGNOSIS — W07XXXA Fall from chair, initial encounter: Secondary | ICD-10-CM | POA: Insufficient documentation

## 2018-12-08 DIAGNOSIS — M542 Cervicalgia: Secondary | ICD-10-CM | POA: Diagnosis not present

## 2018-12-08 DIAGNOSIS — S0001XA Abrasion of scalp, initial encounter: Secondary | ICD-10-CM | POA: Insufficient documentation

## 2018-12-08 DIAGNOSIS — Y999 Unspecified external cause status: Secondary | ICD-10-CM | POA: Insufficient documentation

## 2018-12-08 NOTE — ED Notes (Signed)
[facility] staff called to state they didn't see discharge paperwork - AVS successfully faxed to facility.

## 2018-12-08 NOTE — ED Notes (Signed)
IV removed from pt's left [facility], per Ronalee Belts - RN. IV site was clean, dry and intact.

## 2018-12-08 NOTE — Discharge Instructions (Addendum)
You were evaluated in the Emergency Department and after careful evaluation, we did not find any emergent condition requiring admission or further testing in the hospital.  Your symptoms today seem to be due to bruising from a fall.  Please return to the Emergency Department if you experience any worsening of your condition or if you experience fever, increased confusion, chest pain.  We encourage you to follow up with a primary care provider.  Thank you for allowing [place] to be a part of your care.

## 2018-12-08 NOTE — ED Notes (Signed)
[facility] called for transport home. 

## 2018-12-08 NOTE — ED Notes (Signed)
[facility] called 

## 2018-12-08 NOTE — ED Triage Notes (Signed)
Patient presents to ed via [facility] was found in the floor at [facility], unwitnessed fall, patient has hematoma to occipital area. Patient is on blood thinners.

## 2018-12-08 NOTE — ED Provider Notes (Signed)
[facility] Emergency Department Provider Note MRN:  [id number]  Arrival date & time: 12/08/18     Chief Complaint   Fall   History of Present Illness   Nancy Rowland is a 84 y.o. year-old female with a history of dementia, stroke presenting to the ED with chief complaint of fall.  Unwitnessed fall off of a recliner at her memory unit care facility.  Evidence of trauma to the back of the head, report of patient complaining of neck pain with EMS, c-collar in place.  Per facility, patient is at her baseline mental status.  Patient currently denies any other pain or symptoms.  I was unable to obtain an accurate HPI, PMH, or ROS due to the patient's dementia.  Review of Systems  Positive for ground-level fall, dementia.  Patient's Health History    Past Medical History:  Diagnosis Date  . Cancer ([place])   . Dementia ([place])   . Depression   . Stroke [facility]) 08/2014    Past Surgical History:  Procedure Laterality Date  . ABDOMINAL HYSTERECTOMY    . TEE WITHOUT CARDIOVERSION N/A 11/04/2015   Procedure: TRANSESOPHAGEAL ECHOCARDIOGRAM (TEE);  Surgeon: Skeet Latch, MD;  Location: [facility] ENDOSCOPY;  Service: Cardiovascular;  Laterality: N/A;    Family History  Problem Relation Age of Onset  . Stroke Mother   . Diabetes Mellitus II Father     Social History   Socioeconomic History  . Marital status: Married    Spouse name: Not on file  . Number of children: Not on file  . Years of education: Not on file  . Highest education level: Not on file  Occupational History  . Not on file  Social Needs  . Financial resource strain: Not on file  . Food insecurity:    Worry: Not on file    Inability: Not on file  . Transportation needs:    Medical: Not on file    Non-medical: Not on file  Tobacco Use  . Smoking status: Never Smoker  . Smokeless tobacco: Never Used  Substance and Sexual Activity  . Alcohol use: Never    Alcohol/week: 0.0 standard drinks   Frequency: Never  . Drug use: Never  . Sexual activity: Not on file  Lifestyle  . Physical activity:    Days per week: Not on file    Minutes per session: Not on file  . Stress: Not on file  Relationships  . Social connections:    Talks on phone: Not on file    Gets together: Not on file    Attends religious service: Not on file    Active member of club or organization: Not on file    Attends meetings of clubs or organizations: Not on file    Relationship status: Not on file  . Intimate partner violence:    Fear of current or ex partner: Not on file    Emotionally abused: Not on file    Physically abused: Not on file    Forced sexual activity: Not on file  Other Topics Concern  . Not on file  Social History Narrative  . Not on file     Physical Exam  Vital Signs and Nursing Notes reviewed Vitals:   12/08/18 1055 12/08/18 1130  BP: (!) 110/59 115/60  Pulse: 75 68  Resp: 17   Temp: 98.4 F (36.9 C)   SpO2: 97% 97%    CONSTITUTIONAL: Well-appearing, NAD NEURO:  Alert and oriented x 3, no focal  deficits EYES:  eyes equal and reactive ENT/NECK:  no LAD, no JVD CARDIO: Regular rate, well-perfused, normal S1 and S2 PULM:  CTAB no wheezing or rhonchi GI/GU:  normal bowel sounds, non-distended, non-tender MSK/SPINE:  No gross deformities, no edema SKIN:  no rash, hematoma and overlying abrasion to the right occipital scalp PSYCH:  Appropriate speech and behavior  Diagnostic and Interventional Summary    EKG Interpretation  Date/Time:  Monday December 08 2018 10:49:36 EST Ventricular Rate:  76 PR Interval:    QRS Duration: 79 QT Interval:  432 QTC Calculation: 486 R Axis:   25 Text Interpretation:  Sinus rhythm RSR' in V1 or V2, right VCD or RVH Borderline prolonged QT interval Confirmed by Gerlene Fee [phone]) on 12/08/2018 10:59:33 AM      Labs Reviewed - No data to display  CT CERVICAL SPINE WO CONTRAST  Final Result    CT HEAD WO CONTRAST  Final Result        Medications - No data to display   Procedures Critical Care  ED Course and Medical Decision Making  I have reviewed the triage vital signs and the nursing notes.  Pertinent labs & imaging results that were available during my care of the patient were reviewed by me and considered in my medical decision making (see below for details).  Unwitnessed fall, presumed mechanical in this 84 year old female at her baseline mental status with mild head trauma, possible neck pain reported to EMS.  On anticoagulation, CT is pending.  Patient is otherwise with a nontraumatic exam, no abdominal tenderness, no chest pain or tenderness, good range of motion of the hips, low concern for significant traumatic injury.  Will attempt to obtain more information from staff or family to see if we need to look into primary causes of the fall.  Daughter now at bedside and offers no more information to the story.  Given that the fall was unwitnessed, laboratory evaluation for electrolytes with troponin offered.  Shared decision making utilized, we decided to decline further testing today given that patient does have a history of falls and there has been no change to her symptoms recently, patient is at her baseline mental status currently.  Strict return precautions for chest pain, increased confusion, fever.  After the discussed management above, the patient was determined to be safe for discharge.  The patient was in agreement with this plan and all questions regarding their care were answered.  ED return precautions were discussed and the patient will return to the ED with any significant worsening of condition.  Barth Kirks. Sedonia Small, [place] [email or web address]  Final Clinical Impressions(s) / ED Diagnoses     ICD-10-CM   1. Fall, initial encounter W19.XXXA   2. Minor head injury, initial encounter S09.90XA     ED Discharge Orders    None          Maudie Flakes, MD 12/08/18 1227

## 2019-02-04 ENCOUNTER — Other Ambulatory Visit: Payer: Self-pay

## 2019-02-04 ENCOUNTER — Encounter (HOSPITAL_COMMUNITY): Payer: Self-pay | Admitting: *Deleted

## 2019-02-04 ENCOUNTER — Emergency Department (HOSPITAL_COMMUNITY)
Admission: EM | Admit: 2019-02-04 | Discharge: 2019-02-04 | Disposition: A | Discharge status: Home / Self Care | Payer: Medicare Other | Attending: Emergency Medicine | Admitting: Emergency Medicine

## 2019-02-04 DIAGNOSIS — F0390 Unspecified dementia without behavioral disturbance: Secondary | ICD-10-CM | POA: Diagnosis not present

## 2019-02-04 DIAGNOSIS — Z7982 Long term (current) use of aspirin: Secondary | ICD-10-CM | POA: Diagnosis not present

## 2019-02-04 DIAGNOSIS — Z7902 Long term (current) use of antithrombotics/antiplatelets: Secondary | ICD-10-CM | POA: Diagnosis not present

## 2019-02-04 DIAGNOSIS — Z79899 Other long term (current) drug therapy: Secondary | ICD-10-CM | POA: Diagnosis not present

## 2019-02-04 DIAGNOSIS — N183 Chronic kidney disease, stage 3 (moderate): Secondary | ICD-10-CM | POA: Insufficient documentation

## 2019-02-04 DIAGNOSIS — Z043 Encounter for examination and observation following other accident: Secondary | ICD-10-CM | POA: Insufficient documentation

## 2019-02-04 DIAGNOSIS — I129 Hypertensive chronic kidney disease with stage 1 through stage 4 chronic kidney disease, or unspecified chronic kidney disease: Secondary | ICD-10-CM | POA: Diagnosis not present

## 2019-02-04 DIAGNOSIS — W19XXXA Unspecified fall, initial encounter: Secondary | ICD-10-CM

## 2019-02-04 HISTORY — DX: Unspecified fall, initial encounter: W19.XXXA

## 2019-02-04 LAB — BASIC METABOLIC PANEL
Anion gap: 8 (ref 5–15)
BUN: 21 mg/dL (ref 8–23)
CO2: 22 mmol/L (ref 22–32)
CREATININE: 1.21 mg/dL — AB (ref 0.44–1.00)
Calcium: 8.6 mg/dL — ABNORMAL LOW (ref 8.9–10.3)
Chloride: 112 mmol/L — ABNORMAL HIGH (ref 98–111)
GFR, EST AFRICAN AMERICAN: 47 mL/min — AB (ref >60)
GFR, EST NON AFRICAN AMERICAN: 40 mL/min — AB (ref >60)
Glucose, Bld: 93 mg/dL (ref 70–99)
POTASSIUM: 4.4 mmol/L (ref 3.5–5.1)
SODIUM: 142 mmol/L (ref 135–145)

## 2019-02-04 LAB — CBC
HCT: 35.2 % — ABNORMAL LOW (ref 36.0–46.0)
HEMOGLOBIN: 10.8 g/dL — AB (ref 12.0–15.0)
MCH: 30 pg (ref 26.0–34.0)
MCHC: 30.7 g/dL (ref 30.0–36.0)
MCV: 97.8 fL (ref 80.0–100.0)
PLATELETS: 163 10*3/uL (ref 150–400)
RBC: 3.6 MIL/uL — AB (ref 3.87–5.11)
RDW: 13.3 % (ref 11.5–15.5)
WBC: 6.8 10*3/uL (ref 4.0–10.5)
nRBC: 0 % (ref 0.0–0.2)

## 2019-02-04 MED ORDER — FENTANYL CITRATE (PF) 100 MCG/2ML IJ SOLN: 100.0000 ug | Freq: Once | INTRAMUSCULAR | Status: DC

## 2019-02-04 NOTE — ED Triage Notes (Signed)
PT had unwitnessed Fall at  [facility] . The  Had a shower at 1000 this AM . Pt did not show up for exercise class. Staff found Pt on floor in her ARM. No back or neck pain.

## 2019-02-04 NOTE — ED Triage Notes (Signed)
Unable to obtain blood sample  2 attempts  By 2 Rn's

## 2019-02-04 NOTE — ED Notes (Signed)
Called [facility] pt is second in line for transport at this time.

## 2019-02-04 NOTE — ED Notes (Signed)
[facility] transported pt to facility.

## 2019-02-04 NOTE — ED Triage Notes (Signed)
TC to [employer] [facility]  To report  Pt has been DC . Gold DNR is in Envelope with AVS .

## 2019-02-04 NOTE — ED Provider Notes (Signed)
[place] EMERGENCY DEPARTMENT Provider Note   CSN: [id number] Arrival date & time: 02/04/19  1148    History   Chief Complaint Chief Complaint  Patient presents with  . Fall    HPI Nancy Rowland is a 85 y.o. female.  Level 5 caveat: Dementia     HPI Patient is an 85 year old female who is brought to the emergency department after an unwitnessed fall at a skilled nursing facility today.  She has a history of dementia and cannot provide any history regarding the fall.  The patient did not show up for her exercise class and this is what alerted staff.  Patient was found on the floor in her room.   Past Medical History:  Diagnosis Date  . Cancer ([place])   . Dementia ([place])   . Depression   . Fall 02/04/2019  . Stroke [facility]) 08/2014    Patient Active Problem List   Diagnosis Date Noted  . Depression 02/11/2018  . CKD (chronic kidney disease), stage III ([place]) 02/11/2018  . Nondisplaced comminuted fracture of left patella, initial encounter for closed fracture 07/15/2017  . Pyuria 06/26/2017  . Dementia ([place]) 06/25/2017  . Fall 06/25/2017  . Closed fracture of left distal radius 05/27/2017  . Ischemic stroke ([place])   . History of stroke   . HLD (hyperlipidemia)   . Essential hypertension   . TIA (transient ischemic attack) 11/02/2015    Past Surgical History:  Procedure Laterality Date  . ABDOMINAL HYSTERECTOMY    . TEE WITHOUT CARDIOVERSION N/A 11/04/2015   Procedure: TRANSESOPHAGEAL ECHOCARDIOGRAM (TEE);  Surgeon: Skeet Latch, MD;  Location: [facility] ENDOSCOPY;  Service: Cardiovascular;  Laterality: N/A;     OB History   No obstetric history on file.      Home Medications    Prior to Admission medications   Medication Sig Start Date End Date Taking? Authorizing Provider  acetaminophen (TYLENOL) 325 MG tablet Take 650 mg by mouth every 4 (four) hours as needed for mild pain.    [provider]  alendronate (FOSAMAX) 70 MG tablet  Take 70 mg by mouth once a week. Take with a full glass of water on an empty stomach.    [provider]  ARIPiprazole (ABILIFY) 2 MG tablet Take 1 mg by mouth daily.    [provider]  aspirin EC 81 MG EC tablet Take 1 tablet (81 mg total) by mouth daily. 02/14/18   Kayleen Memos, DO  atorvastatin (LIPITOR) 80 MG tablet Take 1 tablet (80 mg total) by mouth daily. 02/14/18   Kayleen Memos, DO  Calcium Carbonate-Vitamin D3 (CALCIUM 600-D) 600-400 MG-UNIT TABS Take 1 tablet by mouth 2 (two) times daily.    [provider]  calcium-vitamin D (OSCAL WITH D) 500-200 MG-UNIT tablet Take 1 tablet by mouth 2 (two) times daily. 06/26/17   Raiford Noble Latif, DO  clopidogrel (PLAVIX) 75 MG tablet Take 1 tablet (75 mg total) by mouth daily. 02/14/18   Kayleen Memos, DO  LORazepam (ATIVAN) 0.5 MG tablet Take 0.5 mg by mouth every morning. For Anxiety    [provider]  LORazepam (ATIVAN) 0.5 MG tablet Take 0.5 mg by mouth daily as needed for anxiety.    [provider]  losartan (COZAAR) 25 MG tablet Take 12.5 mg by mouth daily.     [provider]  Melatonin 3 MG TABS Take 3 mg by mouth at bedtime.    [provider]  senna-docusate (  SENOKOT-S) 8.6-50 MG tablet Take 1 tablet by mouth at bedtime. 11/04/15   Dellia Nims, MD  sertraline (ZOLOFT) 100 MG tablet Take 100 mg by mouth daily.    [provider]    Family History Family History  Problem Relation Age of Onset  . Stroke Mother   . Diabetes Mellitus II Father     Social History Social History   Tobacco Use  . Smoking status: Never Smoker  . Smokeless tobacco: Never Used  Substance Use Topics  . Alcohol use: Never    Alcohol/week: 0.0 standard drinks    Frequency: Never  . Drug use: Never     Allergies   Patient has no known allergies.   Review of Systems Review of Systems  Unable to perform ROS: Dementia     Physical Exam Updated Vital Signs BP (!)  127/56 (BP Location: Left Arm)   Pulse 68   Temp 98.5 F (36.9 C) (Oral)   Resp 20   Ht 5\' 4"  (1.626 m)   Wt 77.1 kg   SpO2 93%   BMI 29.18 kg/m   Physical Exam Vitals signs and nursing note reviewed.  Constitutional:      General: She is not in acute distress.    Appearance: She is well-developed.  HENT:     Head: Normocephalic and atraumatic.     Right Ear: Tympanic membrane normal.     Left Ear: Tympanic membrane normal.  Neck:     Musculoskeletal: Normal range of motion.  Cardiovascular:     Rate and Rhythm: Normal rate and regular rhythm.     Heart sounds: Normal heart sounds.  Pulmonary:     Effort: Pulmonary effort is normal.     Breath sounds: Normal breath sounds.  Abdominal:     General: There is no distension.     Palpations: Abdomen is soft.     Tenderness: There is no abdominal tenderness.  Musculoskeletal: Normal range of motion.     Comments: Full range of motion of bilateral hips, knees, ankles.  Full range of motion bilateral shoulders, elbows, wrist.  Skin:    General: Skin is warm and dry.  Neurological:     General: No focal deficit present.     Mental Status: She is alert. Mental status is at baseline.  Psychiatric:        Judgment: Judgment normal.      ED Treatments / Results  Labs (all labs ordered are listed, but only abnormal results are displayed) Labs Reviewed  CBC - Abnormal; Notable for the following components:      Result Value   RBC 3.60 (*)    Hemoglobin 10.8 (*)    HCT 35.2 (*)    All other components within normal limits  BASIC METABOLIC PANEL - Abnormal; Notable for the following components:   Chloride 112 (*)    Creatinine, Ser 1.21 (*)    Calcium 8.6 (*)    GFR calc non Af Amer 40 (*)    GFR calc Af Amer 47 (*)    All other components within normal limits    EKG None  Radiology No results found.  Procedures Procedures (including critical care time)  Medications Ordered in ED Medications - No data to  display   Initial Impression / Assessment and Plan / ED Course  I have reviewed the triage vital signs and the nursing notes.  Pertinent labs & imaging results that were available during my care of the patient were reviewed  by me and considered in my medical decision making (see chart for details).       No signs of trauma.  Work-up in the emergency department without significant abnormality.  Full range of motion of major joints.  No trauma to her head.  Attempted to call the patient's daughter who currently is not answering.  Patient will be discharged back to her facility at this time for close follow-up with her primary care team.  I do not think she needs additional testing at this time.  I do not think she needs acute hospitalization.  I do not think she needs imaging of her head at this time.  No weakness of her arms or legs.   Final Clinical Impressions(s) / ED Diagnoses   Final diagnoses:  Fall, initial encounter    ED Discharge Orders    None       Jola Schmidt, MD 02/04/19 1538

## 2019-02-09 ENCOUNTER — Emergency Department (HOSPITAL_COMMUNITY): Payer: Medicare Other

## 2019-02-09 ENCOUNTER — Other Ambulatory Visit: Payer: Self-pay

## 2019-02-09 ENCOUNTER — Emergency Department (HOSPITAL_COMMUNITY)
Admission: EM | Admit: 2019-02-09 | Discharge: 2019-02-10 | Disposition: A | Discharge status: Home / Self Care | Payer: Medicare Other | Attending: Emergency Medicine | Admitting: Emergency Medicine

## 2019-02-09 DIAGNOSIS — Z7982 Long term (current) use of aspirin: Secondary | ICD-10-CM | POA: Diagnosis not present

## 2019-02-09 DIAGNOSIS — I129 Hypertensive chronic kidney disease with stage 1 through stage 4 chronic kidney disease, or unspecified chronic kidney disease: Secondary | ICD-10-CM | POA: Diagnosis not present

## 2019-02-09 DIAGNOSIS — Z7902 Long term (current) use of antithrombotics/antiplatelets: Secondary | ICD-10-CM | POA: Insufficient documentation

## 2019-02-09 DIAGNOSIS — F0390 Unspecified dementia without behavioral disturbance: Secondary | ICD-10-CM | POA: Insufficient documentation

## 2019-02-09 DIAGNOSIS — Z859 Personal history of malignant neoplasm, unspecified: Secondary | ICD-10-CM | POA: Diagnosis not present

## 2019-02-09 DIAGNOSIS — R1030 Lower abdominal pain, unspecified: Secondary | ICD-10-CM | POA: Diagnosis present

## 2019-02-09 DIAGNOSIS — N183 Chronic kidney disease, stage 3 (moderate): Secondary | ICD-10-CM | POA: Insufficient documentation

## 2019-02-09 DIAGNOSIS — N1330 Unspecified hydronephrosis: Secondary | ICD-10-CM | POA: Diagnosis not present

## 2019-02-09 DIAGNOSIS — N3001 Acute cystitis with hematuria: Secondary | ICD-10-CM | POA: Diagnosis not present

## 2019-02-09 DIAGNOSIS — Z79899 Other long term (current) drug therapy: Secondary | ICD-10-CM | POA: Diagnosis not present

## 2019-02-09 DIAGNOSIS — N179 Acute kidney failure, unspecified: Secondary | ICD-10-CM

## 2019-02-09 LAB — COMPREHENSIVE METABOLIC PANEL
ALT: 16 U/L (ref 0–44)
AST: 17 U/L (ref 15–41)
Albumin: 3.9 g/dL (ref 3.5–5.0)
Alkaline Phosphatase: 70 U/L (ref 38–126)
Anion gap: 10 (ref 5–15)
BUN: 39 mg/dL — ABNORMAL HIGH (ref 8–23)
CO2: 22 mmol/L (ref 22–32)
Calcium: 8.7 mg/dL — ABNORMAL LOW (ref 8.9–10.3)
Chloride: 106 mmol/L (ref 98–111)
Creatinine, Ser: 1.83 mg/dL — ABNORMAL HIGH (ref 0.44–1.00)
GFR calc Af Amer: 28 mL/min — ABNORMAL LOW (ref >60)
GFR calc non Af Amer: 24 mL/min — ABNORMAL LOW (ref >60)
Glucose, Bld: 111 mg/dL — ABNORMAL HIGH (ref 70–99)
Potassium: 4.6 mmol/L (ref 3.5–5.1)
Sodium: 138 mmol/L (ref 135–145)
Total Bilirubin: 0.9 mg/dL (ref 0.3–1.2)
Total Protein: 7 g/dL (ref 6.5–8.1)

## 2019-02-09 LAB — CBC WITH DIFFERENTIAL/PLATELET
Abs Immature Granulocytes: 0.05 10*3/uL (ref 0.00–0.07)
Basophils Absolute: 0 10*3/uL (ref 0.0–0.1)
Basophils Relative: 0 %
Eosinophils Absolute: 0.1 10*3/uL (ref 0.0–0.5)
Eosinophils Relative: 1 %
HCT: 36.3 % (ref 36.0–46.0)
HEMOGLOBIN: 11.4 g/dL — AB (ref 12.0–15.0)
Immature Granulocytes: 1 %
LYMPHS PCT: 17 %
Lymphs Abs: 1.9 10*3/uL (ref 0.7–4.0)
MCH: 31.1 pg (ref 26.0–34.0)
MCHC: 31.4 g/dL (ref 30.0–36.0)
MCV: 98.9 fL (ref 80.0–100.0)
Monocytes Absolute: 1.4 10*3/uL — ABNORMAL HIGH (ref 0.1–1.0)
Monocytes Relative: 13 %
Neutro Abs: 7.6 10*3/uL (ref 1.7–7.7)
Neutrophils Relative %: 68 %
Platelets: 173 10*3/uL (ref 150–400)
RBC: 3.67 MIL/uL — ABNORMAL LOW (ref 3.87–5.11)
RDW: 13.4 % (ref 11.5–15.5)
WBC: 11 10*3/uL — ABNORMAL HIGH (ref 4.0–10.5)
nRBC: 0 % (ref 0.0–0.2)

## 2019-02-09 LAB — URINALYSIS, ROUTINE W REFLEX MICROSCOPIC
Bilirubin Urine: NEGATIVE
Glucose, UA: NEGATIVE mg/dL
Ketones, ur: NEGATIVE mg/dL
Nitrite: NEGATIVE
Protein, ur: 30 mg/dL — AB
RBC / HPF: >50 RBC/hpf — ABNORMAL HIGH (ref 0–5)
Specific Gravity, Urine: 1.021 (ref 1.005–1.030)
pH: 5 (ref 5.0–8.0)

## 2019-02-09 LAB — I-STAT CREATININE, ED: Creatinine, Ser: 2.1 mg/dL — ABNORMAL HIGH (ref 0.44–1.00)

## 2019-02-09 LAB — LIPASE, BLOOD: Lipase: 27 U/L (ref 11–51)

## 2019-02-09 MED ORDER — TRAMADOL HCL 50 MG PO TABS: 50.0000 mg | ORAL_TABLET | Freq: Four times a day (QID) | ORAL | 0 refills | Status: AC | PRN

## 2019-02-09 MED ORDER — SODIUM CHLORIDE 0.9 % IV BOLUS
500.0000 mL | Freq: Once | INTRAVENOUS | Status: AC
  Administered 2019-02-09: 500 mL via INTRAVENOUS

## 2019-02-09 MED ORDER — SODIUM CHLORIDE 0.9 % IV SOLN
1.0000 g | Freq: Once | INTRAVENOUS | Status: AC
  Administered 2019-02-09: 1 g via INTRAVENOUS
  Filled 2019-02-09: qty 10

## 2019-02-09 MED ORDER — CEPHALEXIN 500 MG PO CAPS: 500.0000 mg | ORAL_CAPSULE | Freq: Three times a day (TID) | ORAL | 0 refills | Status: AC

## 2019-02-09 NOTE — Discharge Instructions (Addendum)
Take keflex three times daily for a week.   Stay hydrated.   Take tramadol as needed for severe pain.   You may have passed a kidney stone. Your kidney function is slightly abnormal   You need to see urologist in a week   You need your kidney function repeated in a week   Return to ER if you have worse abdominal pain, flank pain, fever, vomiting.

## 2019-02-09 NOTE — ED Notes (Signed)
Bed: MB31 Expected date:  Expected time:  Means of arrival:  Comments: 85 yo F/ abd pain

## 2019-02-09 NOTE — ED Provider Notes (Signed)
[facility] DEPT Provider Note   CSN: [id number] Arrival date & time: 02/09/19  2001    History   Chief Complaint Chief Complaint  Patient presents with  . Abdominal Pain    HPI DICY SMIGEL is a 85 y.o. female history of dementia, depression, previous stroke here presenting with suprapubic pain.  Patient states that she has been having lower abdominal and suprapubic pain for the last several days.  Patient demented unable to give me much history.  She was unable to tell me if she has any dysuria or not.  Patient was seen here about a week ago for unwitnessed fall and had unremarkable CT head and labs.      The history is provided by the patient and the EMS personnel.  Level V caveat- dementia   Past Medical History:  Diagnosis Date  . Cancer ([place])   . Dementia ([place])   . Depression   . Fall 02/04/2019  . Stroke [facility]) 08/2014    Patient Active Problem List   Diagnosis Date Noted  . Depression 02/11/2018  . CKD (chronic kidney disease), stage III ([place]) 02/11/2018  . Nondisplaced comminuted fracture of left patella, initial encounter for closed fracture 07/15/2017  . Pyuria 06/26/2017  . Dementia ([place]) 06/25/2017  . Fall 06/25/2017  . Closed fracture of left distal radius 05/27/2017  . Ischemic stroke ([place])   . History of stroke   . HLD (hyperlipidemia)   . Essential hypertension   . TIA (transient ischemic attack) 11/02/2015    Past Surgical History:  Procedure Laterality Date  . ABDOMINAL HYSTERECTOMY    . TEE WITHOUT CARDIOVERSION N/A 11/04/2015   Procedure: TRANSESOPHAGEAL ECHOCARDIOGRAM (TEE);  Surgeon: Skeet Latch, MD;  Location: [facility] ENDOSCOPY;  Service: Cardiovascular;  Laterality: N/A;     OB History   No obstetric history on file.      Home Medications    Prior to Admission medications   Medication Sig Start Date End Date Taking? Authorizing Provider  acetaminophen (TYLENOL) 325 MG tablet Take 650 mg by mouth  every 4 (four) hours as needed for mild pain.   Yes [provider]  alendronate (FOSAMAX) 70 MG tablet Take 70 mg by mouth once a week. Take with a full glass of water on an empty stomach.   Yes [provider]  ARIPiprazole (ABILIFY) 2 MG tablet Take 1 mg by mouth daily.   Yes [provider]  aspirin EC 81 MG EC tablet Take 1 tablet (81 mg total) by mouth daily. 02/14/18  Yes Hall, Carole N, DO  atorvastatin (LIPITOR) 80 MG tablet Take 1 tablet (80 mg total) by mouth daily. 02/14/18  Yes Kayleen Memos, DO  Calcium Carbonate-Vitamin D3 (CALCIUM 600-D) 600-400 MG-UNIT TABS Take 1 tablet by mouth 2 (two) times daily.   Yes [provider]  calcium-vitamin D (OSCAL WITH D) 500-200 MG-UNIT tablet Take 1 tablet by mouth 2 (two) times daily. 06/26/17  Yes Sheikh, Omair Latif, DO  clopidogrel (PLAVIX) 75 MG tablet Take 1 tablet (75 mg total) by mouth daily. 02/14/18  Yes Kayleen Memos, DO  loperamide (IMODIUM) 2 MG capsule Take 2 mg by mouth as needed for diarrhea or loose stools.   Yes [provider]  LORazepam (ATIVAN) 0.5 MG tablet Take 0.5 mg by mouth every morning. For Anxiety   Yes [provider]  LORazepam (ATIVAN) 0.5 MG tablet Take 0.5 mg by mouth daily as needed for anxiety.   Yes  [provider]  losartan (COZAAR) 25 MG tablet Take 12.5 mg by mouth daily.    Yes [provider]  Melatonin 3 MG TABS Take 3 mg by mouth at bedtime.   Yes [provider]  ondansetron (ZOFRAN) 4 MG tablet Take 4 mg by mouth every 8 (eight) hours as needed for nausea or vomiting.   Yes [provider]  senna-docusate (SENOKOT-S) 8.6-50 MG tablet Take 1 tablet by mouth at bedtime. 11/04/15  Yes Ahmed, Chesley Mires, MD  sertraline (ZOLOFT) 100 MG tablet Take 100 mg by mouth daily.   Yes [provider]    Family History Family History  Problem Relation Age of Onset  . Stroke Mother   . Diabetes Mellitus II Father      Social History Social History   Tobacco Use  . Smoking status: Never Smoker  . Smokeless tobacco: Never Used  Substance Use Topics  . Alcohol use: Never    Alcohol/week: 0.0 standard drinks    Frequency: Never  . Drug use: Never     Allergies   Patient has no known allergies.   Review of Systems Review of Systems  Gastrointestinal: Positive for abdominal pain.  All other systems reviewed and are negative.    Physical Exam Updated Vital Signs BP (!) 153/82 (BP Location: Right Arm)   Pulse 71   Temp 98.9 F (37.2 C) (Oral)   Resp 18   SpO2 100%   Physical Exam Vitals signs and nursing note reviewed.  Constitutional:      Comments: Demented   HENT:     Head: Normocephalic.     Mouth/Throat:     Mouth: Mucous membranes are moist.  Eyes:     Extraocular Movements: Extraocular movements intact.  Cardiovascular:     Rate and Rhythm: Normal rate and regular rhythm.     Heart sounds: Normal heart sounds.  Pulmonary:     Effort: Pulmonary effort is normal.     Breath sounds: Normal breath sounds.  Abdominal:     General: Abdomen is flat. Bowel sounds are normal.     Palpations: Abdomen is soft.     Comments: Mild suprapubic tenderness   Skin:    General: Skin is warm.     Capillary Refill: Capillary refill takes less than 2 seconds.  Neurological:     General: No focal deficit present.     Mental Status: She is alert.     Comments: Demented   Psychiatric:        Mood and Affect: Mood normal.      ED Treatments / Results  Labs (all labs ordered are listed, but only abnormal results are displayed) Labs Reviewed  CBC WITH DIFFERENTIAL/PLATELET - Abnormal; Notable for the following components:      Result Value   WBC 11.0 (*)    RBC 3.67 (*)    Hemoglobin 11.4 (*)    Monocytes Absolute 1.4 (*)    All other components within normal limits  COMPREHENSIVE METABOLIC PANEL - Abnormal; Notable for the following components:   Glucose, Bld 111 (*)     BUN 39 (*)    Creatinine, Ser 1.83 (*)    Calcium 8.7 (*)    GFR calc non Af Amer 24 (*)    GFR calc Af Amer 28 (*)    All other components within normal limits  URINALYSIS, ROUTINE W REFLEX MICROSCOPIC - Abnormal; Notable for the following components:   APPearance HAZY (*)    Hgb  urine dipstick LARGE (*)    Protein, ur 30 (*)    Leukocytes,Ua MODERATE (*)    RBC / HPF >50 (*)    Bacteria, UA RARE (*)    All other components within normal limits  I-STAT CREATININE, ED - Abnormal; Notable for the following components:   Creatinine, Ser 2.10 (*)    All other components within normal limits  URINE CULTURE  LIPASE, BLOOD    EKG None  Radiology Ct Renal Stone Study  Result Date: 02/09/2019 CLINICAL DATA:  Flank pain. Nursing staff reports patient complained to suprapubic pain for 1 day. EXAM: CT ABDOMEN AND PELVIS WITHOUT CONTRAST TECHNIQUE: Multidetector CT imaging of the abdomen and pelvis was performed following the standard protocol without IV contrast. COMPARISON:  None. FINDINGS: Dementia patient with difficulty with breath hold technique, moderate breathing motion artifact. Lower chest: Mild dependent pleural thickening both lung bases. Dependent atelectasis. Hepatobiliary: No focal hepatic abnormality. Physiologically distended gallbladder. Possible but not definite gallstones. No biliary dilatation. Pancreas: Near complete fatty atrophy. No ductal dilatation or inflammation. Spleen: Normal in size without focal abnormality. Adrenals/Urinary Tract: No adrenal nodule. Moderate to severe right hydroureteronephrosis. Gradual transition from dilated to nondilated ureter in the pelvis without abrupt transition or ureteral calculi. Punctate nonobstructing stone in the lower right kidney. There is mild right perinephric edema. Punctate calcification may be within the dependent bladder, however on sagittal reformats may be external and represent a phlebolith. No bladder wall thickening. No left  hydronephrosis or hydroureter. No left urolithiasis. Hyperdense 17 mm lesion from the lower left kidney has Hounsfield units of 72 and is consistent with benign hemorrhagic cyst. Stomach/Bowel: Small hiatal hernia. Stomach is nondistended. Bowel evaluation is suboptimal in the absence of enteric contrast and motion artifact. No evidence of bowel wall thickening, inflammatory change, or obstruction. Normal appendix. Colonic diverticulosis, prominent in the sigmoid colon, without diverticulitis. Mild fecalization of distal small bowel contents. Vascular/Lymphatic: Aortic and branch atherosclerosis. No aneurysm. No adenopathy. Reproductive: Status post hysterectomy. No adnexal masses. Other: No free air, free fluid, or intra-abdominal fluid collection. Musculoskeletal: There are no acute or suspicious osseous abnormalities. Degenerative change of both hips. IMPRESSION: 1. Moderate to severe right hydroureteronephrosis with mild perinephric edema. No obstructing stone or cause of obstruction is seen. There is a punctate calcification in the midline in the pelvis that may be a dependent and recently passed bladder stone versus external phlebolith. Direct visualization with cystoscopy may be of value. 2. Punctate nonobstructing stone in the lower right kidney. 3. Colonic diverticulosis without diverticulitis. Aortic Atherosclerosis (ICD10-I70.0). Electronically Signed   By: Keith Rake M.D.   On: 02/09/2019 22:22    Procedures Procedures (including critical care time)  Medications Ordered in ED Medications  sodium chloride 0.9 % bolus 500 mL (0 mLs Intravenous Stopped 02/09/19 2150)  cefTRIAXone (ROCEPHIN) 1 g in sodium chloride 0.9 % 100 mL IVPB (0 g Intravenous Stopped 02/09/19 2151)     Initial Impression / Assessment and Plan / ED Course  I have reviewed the triage vital signs and the nursing notes.  Pertinent labs & imaging results that were available during my care of the patient were reviewed by me  and considered in my medical decision making (see chart for details).       MARVIE BREVIK is a 85 y.o. female here with suprapubic pain. Consider UTI vs colitis vs diverticulitis. Will get labs, UA, CT ab/pel.   11:06 PM UA + blood and ? UTI. CT showed mod  to severe R hydro but no stone. Likely passed a stone vs stricture. Cr slightly elevated compared to a week ago. I talked to Dr. Natividad Brood from urology. He reviewed scans and agreed with abx and will have patient follow up in [facility] in a week for renal ultrasound. Will dc back with keflex. Will need repeat BMP in a week.    Final Clinical Impressions(s) / ED Diagnoses   Final diagnoses:  None    ED Discharge Orders    None       Drenda Freeze, MD 02/09/19 2307

## 2019-02-09 NOTE — ED Triage Notes (Signed)
Per pt staff at nursing home, pt c/o suprapubic pain x1day.

## 2019-02-10 NOTE — ED Notes (Signed)
[employer] notified of need for transport of pt back to residence.  

## 2019-02-11 LAB — URINE CULTURE: Culture: NO GROWTH

## 2019-03-17 ENCOUNTER — Emergency Department (HOSPITAL_COMMUNITY)
Admission: EM | Admit: 2019-03-17 | Discharge: 2019-03-17 | Disposition: A | Discharge status: Home / Self Care | Payer: Medicare Other | Attending: Emergency Medicine | Admitting: Emergency Medicine

## 2019-03-17 ENCOUNTER — Encounter (HOSPITAL_COMMUNITY): Payer: Self-pay | Admitting: Radiology

## 2019-03-17 ENCOUNTER — Emergency Department (HOSPITAL_COMMUNITY): Payer: Medicare Other

## 2019-03-17 DIAGNOSIS — Y92128 Other place in nursing home as the place of occurrence of the external cause: Secondary | ICD-10-CM | POA: Insufficient documentation

## 2019-03-17 DIAGNOSIS — W19XXXA Unspecified fall, initial encounter: Secondary | ICD-10-CM | POA: Insufficient documentation

## 2019-03-17 DIAGNOSIS — S0990XA Unspecified injury of head, initial encounter: Secondary | ICD-10-CM | POA: Insufficient documentation

## 2019-03-17 DIAGNOSIS — N183 Chronic kidney disease, stage 3 (moderate): Secondary | ICD-10-CM | POA: Diagnosis not present

## 2019-03-17 DIAGNOSIS — Z79899 Other long term (current) drug therapy: Secondary | ICD-10-CM | POA: Insufficient documentation

## 2019-03-17 DIAGNOSIS — Z7982 Long term (current) use of aspirin: Secondary | ICD-10-CM | POA: Insufficient documentation

## 2019-03-17 DIAGNOSIS — Z7902 Long term (current) use of antithrombotics/antiplatelets: Secondary | ICD-10-CM | POA: Insufficient documentation

## 2019-03-17 DIAGNOSIS — I129 Hypertensive chronic kidney disease with stage 1 through stage 4 chronic kidney disease, or unspecified chronic kidney disease: Secondary | ICD-10-CM | POA: Insufficient documentation

## 2019-03-17 DIAGNOSIS — Y999 Unspecified external cause status: Secondary | ICD-10-CM | POA: Diagnosis not present

## 2019-03-17 DIAGNOSIS — F0390 Unspecified dementia without behavioral disturbance: Secondary | ICD-10-CM | POA: Diagnosis not present

## 2019-03-17 DIAGNOSIS — Y9301 Activity, walking, marching and hiking: Secondary | ICD-10-CM | POA: Diagnosis not present

## 2019-03-17 NOTE — ED Notes (Signed)
Update provided to daughter, Amy.

## 2019-03-17 NOTE — ED Notes (Signed)
ED Provider at bedside. 

## 2019-03-17 NOTE — ED Notes (Signed)
PTAR called  

## 2019-03-17 NOTE — ED Notes (Signed)
Bed: GU44 Expected date:  Expected time:  Means of arrival:  Comments: EMS 85yo fall

## 2019-03-17 NOTE — ED Provider Notes (Signed)
[facility] DEPT Provider Note   CSN: [id number] Arrival date & time: 03/17/19  1106    History   Chief Complaint Chief Complaint  Patient presents with  . Fall    HPI Nancy Rowland is a 85 y.o. female.     Patient is an 85 year old female with history of stroke, dementia, chronic kidney disease who is presenting from nursing facility today after having an unwitnessed fall.  Patient states that she was walking out into the hall and fell.  She is complaining of pain in the back of her head but denies any injury anywhere else.  Patient is hard of hearing and can only answer questions occasionally.  She takes plavix and there was no notable loss of consciousness.  Per facility patient is at her baseline.  The history is provided by the patient, the nursing home and the EMS personnel.  Fall     Past Medical History:  Diagnosis Date  . Cancer ([place])   . Dementia ([place])   . Depression   . Fall 02/04/2019  . Stroke [facility]) 08/2014    Patient Active Problem List   Diagnosis Date Noted  . Depression 02/11/2018  . CKD (chronic kidney disease), stage III ([place]) 02/11/2018  . Nondisplaced comminuted fracture of left patella, initial encounter for closed fracture 07/15/2017  . Pyuria 06/26/2017  . Dementia ([place]) 06/25/2017  . Fall 06/25/2017  . Closed fracture of left distal radius 05/27/2017  . Ischemic stroke ([place])   . History of stroke   . HLD (hyperlipidemia)   . Essential hypertension   . TIA (transient ischemic attack) 11/02/2015    Past Surgical History:  Procedure Laterality Date  . ABDOMINAL HYSTERECTOMY    . TEE WITHOUT CARDIOVERSION N/A 11/04/2015   Procedure: TRANSESOPHAGEAL ECHOCARDIOGRAM (TEE);  Surgeon: Skeet Latch, MD;  Location: [facility] ENDOSCOPY;  Service: Cardiovascular;  Laterality: N/A;     OB History   No obstetric history on file.      Home Medications    Prior to Admission medications   Medication Sig Start Date  End Date Taking? Authorizing Provider  acetaminophen (TYLENOL) 325 MG tablet Take 650 mg by mouth every 4 (four) hours as needed for mild pain.    [provider]  alendronate (FOSAMAX) 70 MG tablet Take 70 mg by mouth once a week. Take with a full glass of water on an empty stomach.    [provider]  ARIPiprazole (ABILIFY) 2 MG tablet Take 1 mg by mouth daily.    [provider]  aspirin EC 81 MG EC tablet Take 1 tablet (81 mg total) by mouth daily. 02/14/18   Kayleen Memos, DO  atorvastatin (LIPITOR) 80 MG tablet Take 1 tablet (80 mg total) by mouth daily. 02/14/18   Kayleen Memos, DO  Calcium Carbonate-Vitamin D3 (CALCIUM 600-D) 600-400 MG-UNIT TABS Take 1 tablet by mouth 2 (two) times daily.    [provider]  calcium-vitamin D (OSCAL WITH D) 500-200 MG-UNIT tablet Take 1 tablet by mouth 2 (two) times daily. 06/26/17   Raiford Noble Latif, DO  cephALEXin (KEFLEX) 500 MG capsule Take 1 capsule (500 mg total) by mouth 3 (three) times daily. 02/09/19   Drenda Freeze, MD  clopidogrel (PLAVIX) 75 MG tablet Take 1 tablet (75 mg total) by mouth daily. 02/14/18   Kayleen Memos, DO  loperamide (IMODIUM) 2 MG capsule Take 2 mg by mouth as needed for diarrhea or loose stools.  [provider]  LORazepam (ATIVAN) 0.5 MG tablet Take 0.5 mg by mouth every morning. For Anxiety    [provider]  LORazepam (ATIVAN) 0.5 MG tablet Take 0.5 mg by mouth daily as needed for anxiety.    [provider]  losartan (COZAAR) 25 MG tablet Take 12.5 mg by mouth daily.     [provider]  Melatonin 3 MG TABS Take 3 mg by mouth at bedtime.    [provider]  ondansetron (ZOFRAN) 4 MG tablet Take 4 mg by mouth every 8 (eight) hours as needed for nausea or vomiting.    [provider]  senna-docusate (SENOKOT-S) 8.6-50 MG tablet Take 1 tablet by mouth at bedtime. 11/04/15   Dellia Nims, MD  sertraline (ZOLOFT) 100 MG tablet Take  100 mg by mouth daily.    [provider]  traMADol (ULTRAM) 50 MG tablet Take 1 tablet (50 mg total) by mouth every 6 (six) hours as needed. 02/09/19   Drenda Freeze, MD    Family History Family History  Problem Relation Age of Onset  . Stroke Mother   . Diabetes Mellitus II Father     Social History Social History   Tobacco Use  . Smoking status: Never Smoker  . Smokeless tobacco: Never Used  Substance Use Topics  . Alcohol use: Never    Alcohol/week: 0.0 standard drinks    Frequency: Never  . Drug use: Never     Allergies   Patient has no known allergies.   Review of Systems Review of Systems  All other systems reviewed and are negative.    Physical Exam Updated Vital Signs BP (!) 108/57 (BP Location: Left Arm)   Pulse 73   Temp 98.2 F (36.8 C) (Oral)   Resp 16   SpO2 100%   Physical Exam Vitals signs and nursing note reviewed.  Constitutional:      General: She is in acute distress.     Appearance: She is well-developed.  HENT:     Head: Normocephalic and atraumatic.     Comments: No palpable hematomas to the head Eyes:     Pupils: Pupils are equal, round, and reactive to light.  Neck:     Comments: c-collar in place.  No pain with palpation Cardiovascular:     Rate and Rhythm: Normal rate and regular rhythm.     Heart sounds: Normal heart sounds. No murmur. No friction rub.  Pulmonary:     Effort: Pulmonary effort is normal.     Breath sounds: Normal breath sounds. No wheezing or rales.  Abdominal:     General: Bowel sounds are normal. There is no distension.     Palpations: Abdomen is soft.     Tenderness: There is no abdominal tenderness. There is no guarding or rebound.  Musculoskeletal: Normal range of motion.        General: No tenderness.     Comments: Full ROM of the bilateral shoulders/hips, knees without signs of injury or discomfort.  Skin:    General: Skin is warm and dry.     Findings: No rash.  Neurological:      General: No focal deficit present.     Mental Status: She is alert. Mental status is at baseline.     Cranial Nerves: No cranial nerve deficit.     Comments: Alert and oriented to person  Psychiatric:     Comments: Calm and cooperative      ED Treatments / Results  Labs (all labs ordered are listed, but only abnormal results are displayed) Labs Reviewed - No data to display  EKG None  Radiology Ct Head Wo Contrast  Result Date: 03/17/2019 CLINICAL DATA:  Recent fall EXAM: CT HEAD WITHOUT CONTRAST CT CERVICAL SPINE WITHOUT CONTRAST TECHNIQUE: Multidetector CT imaging of the head and cervical spine was performed following the standard protocol without intravenous contrast. Multiplanar CT image reconstructions of the cervical spine were also generated. COMPARISON:  12/08/2018 FINDINGS: CT HEAD FINDINGS Brain: Mild atrophic changes and chronic white matter ischemic changes are seen. Patchy areas of encephalomalacia are again identified bilaterally consistent with prior multifocal infarcts. These are stable in appearance from the prior exam. Vascular: No hyperdense vessel or unexpected calcification. Skull: Normal. Negative for fracture or focal lesion. Sinuses/Orbits: No acute finding. Other: None. CT CERVICAL SPINE FINDINGS Alignment: Mild straightening of the cervical lordosis is noted but stable. Anterolisthesis of C3 on C4 and C7 on T1 is again identified of a degenerative nature is stable in appearance. Skull base and vertebrae: 7 cervical segments are well visualized. Vertebral body height is well maintained. Multifocal facet hypertrophic changes are noted particularly on the left. No acute fracture or acute facet abnormality is noted. Multifocal neural foraminal narrowing is noted left greater than right. Soft tissues and spinal canal: Surrounding soft tissues are within normal limits. Central canal stenosis is noted at C5-6. Upper chest: Within normal limits. Other: None IMPRESSION: CT of the  head: Chronic infarcts noted bilaterally stable from the previous exam. No acute intracranial abnormality is noted. CT of the cervical spine: Multilevel degenerative change without acute abnormality. Electronically Signed   By: Inez Catalina M.D.   On: 03/17/2019 13:06   Ct Cervical Spine Wo Contrast  Result Date: 03/17/2019 CLINICAL DATA:  Recent fall EXAM: CT HEAD WITHOUT CONTRAST CT CERVICAL SPINE WITHOUT CONTRAST TECHNIQUE: Multidetector CT imaging of the head and cervical spine was performed following the standard protocol without intravenous contrast. Multiplanar CT image reconstructions of the cervical spine were also generated. COMPARISON:  12/08/2018 FINDINGS: CT HEAD FINDINGS Brain: Mild atrophic changes and chronic white matter ischemic changes are seen. Patchy areas of encephalomalacia are again identified bilaterally consistent with prior multifocal infarcts. These are stable in appearance from the prior exam. Vascular: No hyperdense vessel or unexpected calcification. Skull: Normal. Negative for fracture or focal lesion. Sinuses/Orbits: No acute finding. Other: None. CT CERVICAL SPINE FINDINGS Alignment: Mild straightening of the cervical lordosis is noted but stable. Anterolisthesis of C3 on C4 and C7 on T1 is again identified of a degenerative nature is stable in appearance. Skull base and vertebrae: 7 cervical segments are well visualized. Vertebral body height is well maintained. Multifocal facet hypertrophic changes are noted particularly on the left. No acute fracture or acute facet abnormality is noted. Multifocal neural foraminal narrowing is noted left greater than right. Soft tissues and spinal canal: Surrounding soft tissues are within normal limits. Central canal stenosis is noted at C5-6. Upper chest: Within normal limits. Other: None IMPRESSION: CT of the head: Chronic infarcts noted bilaterally stable from the previous exam. No acute intracranial abnormality is noted. CT of the  cervical spine: Multilevel degenerative change without acute abnormality. Electronically Signed   By: Inez Catalina M.D.   On: 03/17/2019 13:06    Procedures Procedures (including critical care time)  Medications Ordered in ED Medications - No data to display   Initial Impression / Assessment and Plan / ED Course  I have reviewed the triage vital signs  and the nursing notes.  Pertinent labs & imaging results that were available during my care of the patient were reviewed by me and considered in my medical decision making (see chart for details).       Patient is an elderly female with a unwitnessed fall at her nursing facility today.  Patient states she was walking out to the hall and fell.  Patient is complaining of head pain but does not appear to have any injury anywhere else on her body.  Able to range all joints without signs of discomfort.  No large hematomas to the scalp.  Patient does take Plavix.  CT of head and C-spine pending.  Patient's vital signs are otherwise reassuring.  Per facility patient is mentating at her baseline.  1:28 PM Imaging is neg and collar removed.  Pt clear for d/c back to facility.  Final Clinical Impressions(s) / ED Diagnoses   Final diagnoses:  Fall, initial encounter    ED Discharge Orders    None       Blanchie Dessert, MD 03/17/19 1330

## 2019-03-17 NOTE — Discharge Instructions (Signed)
Pt had CT scan of head and neck today without signs of injury.  Pt can have tylenol every 6 hours as needed for headache.

## 2019-03-17 NOTE — ED Triage Notes (Signed)
Transported by [facility] from SNF-- unwitnessed fall, hx of dementia (AAO x 1 which is baseline). Patient only reporting "head" pain. C Collar intact.

## 2020-02-08 ENCOUNTER — Encounter (HOSPITAL_COMMUNITY): Payer: Self-pay | Admitting: Emergency Medicine

## 2020-02-08 ENCOUNTER — Emergency Department (HOSPITAL_COMMUNITY): Payer: Medicare Other

## 2020-02-08 ENCOUNTER — Emergency Department (HOSPITAL_COMMUNITY)
Admission: EM | Admit: 2020-02-08 | Discharge: 2020-02-09 | Disposition: A | Discharge status: Other Acute Inpatient Hospital | Payer: Medicare Other | Attending: Emergency Medicine | Admitting: Emergency Medicine

## 2020-02-08 DIAGNOSIS — Y92122 Bedroom in nursing home as the place of occurrence of the external cause: Secondary | ICD-10-CM | POA: Insufficient documentation

## 2020-02-08 DIAGNOSIS — I10 Essential (primary) hypertension: Secondary | ICD-10-CM | POA: Diagnosis not present

## 2020-02-08 DIAGNOSIS — Z8673 Personal history of transient ischemic attack (TIA), and cerebral infarction without residual deficits: Secondary | ICD-10-CM | POA: Insufficient documentation

## 2020-02-08 DIAGNOSIS — Y999 Unspecified external cause status: Secondary | ICD-10-CM | POA: Insufficient documentation

## 2020-02-08 DIAGNOSIS — S0001XA Abrasion of scalp, initial encounter: Secondary | ICD-10-CM | POA: Insufficient documentation

## 2020-02-08 DIAGNOSIS — F0390 Unspecified dementia without behavioral disturbance: Secondary | ICD-10-CM | POA: Insufficient documentation

## 2020-02-08 DIAGNOSIS — Y939 Activity, unspecified: Secondary | ICD-10-CM | POA: Diagnosis not present

## 2020-02-08 DIAGNOSIS — W19XXXA Unspecified fall, initial encounter: Secondary | ICD-10-CM

## 2020-02-08 DIAGNOSIS — W0110XA Fall on same level from slipping, tripping and stumbling with subsequent striking against unspecified object, initial encounter: Secondary | ICD-10-CM | POA: Diagnosis not present

## 2020-02-08 DIAGNOSIS — S0990XA Unspecified injury of head, initial encounter: Secondary | ICD-10-CM | POA: Diagnosis present

## 2020-02-08 HISTORY — DX: Age-related osteoporosis without current pathological fracture: M81.0

## 2020-02-08 HISTORY — DX: Essential (primary) hypertension: I10

## 2020-02-08 HISTORY — DX: Anxiety disorder, unspecified: F41.9

## 2020-02-08 HISTORY — DX: Hyperlipidemia, unspecified: E78.5

## 2020-02-08 HISTORY — DX: Transient cerebral ischemic attack, unspecified: G45.9

## 2020-02-08 HISTORY — DX: Cerebral infarction, unspecified: I63.9

## 2020-02-08 NOTE — Discharge Instructions (Signed)
Please discussed the fall with your physician at your facility.  Return for any concerning symptoms.

## 2020-02-08 NOTE — ED Triage Notes (Signed)
BIB EMS as Lvl 2 Trauma. From [facility], [place]. Pt found on floor by staff just PTA, staff believes pt rolled out of bed. No obvious injury. Hx advanced dementia. Takes Plavix. VSS. Given 2.5 Versed en route for agitation.

## 2020-02-08 NOTE — ED Provider Notes (Signed)
[facility] EMERGENCY DEPARTMENT Provider Note   CSN: [id number] Arrival date & time: 02/08/20  2112     History Chief Complaint  Patient presents with  . Fall    Nancy Rowland is a 86 y.o. female.  86 yo F with a chief complaints of possible fall.  Patient is demented, lives in a skilled nursing facility.  At baseline she will cover state minimally and will scream from time to time.  She was found on the ground after being seen in the bed.  No obvious trauma seen by EMS.  On Plavix.  Level 5 caveat dementia.  The history is provided by the patient and the EMS personnel.  Injury This is a new problem. The current episode started yesterday. The problem occurs constantly. The problem has not changed since onset.Pertinent negatives include no chest pain, no abdominal pain, no headaches and no shortness of breath. Nothing aggravates the symptoms. Nothing relieves the symptoms. She has tried nothing for the symptoms. The treatment provided no relief.       Past Medical History:  Diagnosis Date  . Anxiety   . CVA (cerebral vascular accident) ([place])   . Depression   . Hyperlipidemia   . Hypertension   . Osteoporosis   . TIA (transient ischemic attack)     There are no problems to display for this patient.   History reviewed. No pertinent surgical history.   OB History   No obstetric history on file.     No family history on file.  Social History   Tobacco Use  . Smoking status: Never Smoker  . Smokeless tobacco: Never Used  Substance Use Topics  . Alcohol use: Not Currently  . Drug use: Not Currently    Home Medications Prior to Admission medications   Not on File    Allergies    Patient has no known allergies.  Review of Systems   Review of Systems  Unable to perform ROS: Dementia  Constitutional: Negative for chills and fever.  HENT: Negative for congestion and rhinorrhea.   Eyes: Negative for redness and visual disturbance.   Respiratory: Negative for shortness of breath and wheezing.   Cardiovascular: Negative for chest pain and palpitations.  Gastrointestinal: Negative for abdominal pain, nausea and vomiting.  Genitourinary: Negative for dysuria and urgency.  Musculoskeletal: Negative for arthralgias and myalgias.  Skin: Negative for pallor and wound.  Neurological: Negative for dizziness and headaches.    Physical Exam Updated Vital Signs BP 118/60   Pulse 86   Temp (!) 97.1 F (36.2 C) (Temporal)   Resp 20   Ht 5\' 6"  (1.676 m)   Wt 72.6 kg   SpO2 100%   BMI 25.82 kg/m   Physical Exam Vitals and nursing note reviewed.  Constitutional:      General: She is not in acute distress.    Appearance: She is well-developed. She is not diaphoretic.  HENT:     Head: Normocephalic.     Comments: Small abrasion to the right frontal region. Eyes:     Pupils: Pupils are equal, round, and reactive to light.  Cardiovascular:     Rate and Rhythm: Normal rate and regular rhythm.     Heart sounds: No murmur. No friction rub. No gallop.   Pulmonary:     Effort: Pulmonary effort is normal.     Breath sounds: No wheezing or rales.  Abdominal:     General: There is no distension.  Palpations: Abdomen is soft.     Tenderness: There is no abdominal tenderness.  Musculoskeletal:        General: No tenderness.     Cervical back: Normal range of motion and neck supple.     Comments: No obvious tenderness on palpation from head to toe.  She does scream spontaneously.  Restricted internal and external rotation of the lower extremities bilaterally.  No obvious midline spinal tenderness or deformity.  Other than the mark on her head, and a small skin tear to the right distal forearm.  There are no other signs of trauma.  Skin:    General: Skin is warm and dry.     Comments: Small skin tear to right distal forearm.  No bony ttp.  Neurological:     Mental Status: She is alert and oriented to person, place, and  time.  Psychiatric:        Behavior: Behavior normal.     ED Results / Procedures / Treatments   Labs (all labs ordered are listed, but only abnormal results are displayed) Labs Reviewed - No data to display  EKG None  Radiology CT Head Wo Contrast  Result Date: 02/08/2020 CLINICAL DATA:  Recent fall EXAM: CT HEAD WITHOUT CONTRAST CT CERVICAL SPINE WITHOUT CONTRAST TECHNIQUE: Multidetector CT imaging of the head and cervical spine was performed following the standard protocol without intravenous contrast. Multiplanar CT image reconstructions of the cervical spine were also generated. COMPARISON:  03/17/2019 FINDINGS: CT HEAD FINDINGS Brain: There again noted changes consistent with prior infarcts in the right cerebellar hemisphere, left temporoparietal lobe, right parietal lobe and bilateral occipital lobes. No new area of focal hemorrhage or infarction is seen. Vascular: No hyperdense vessel or unexpected calcification. Skull: Normal. Negative for fracture or focal lesion. Sinuses/Orbits: No acute finding. Other: None. CT CERVICAL SPINE FINDINGS Alignment: Stable in appearance from the prior exam with mild anterolisthesis of C3 on C4 and C7 on T1 of a degenerative nature. Skull base and vertebrae: 7 cervical segments are well visualized. Vertebral body height is well maintained. Multilevel disc space narrowing is seen with osteophytic changes. Facet hypertrophic changes are noted at multiple levels. No findings to suggest acute fracture or acute facet abnormality are seen. Soft tissues and spinal canal: Surrounding soft tissue structures appear within normal limits. Upper chest: Visualized lung apices show mild apical scarring. Other: None IMPRESSION: CT of the head: Chronic changes without acute abnormality. CT of the cervical spine: Degenerative change without acute abnormality. Electronically Signed   By: Inez Catalina M.D.   On: 02/08/2020 21:58   CT Cervical Spine Wo Contrast  Result Date:  02/08/2020 CLINICAL DATA:  Recent fall EXAM: CT HEAD WITHOUT CONTRAST CT CERVICAL SPINE WITHOUT CONTRAST TECHNIQUE: Multidetector CT imaging of the head and cervical spine was performed following the standard protocol without intravenous contrast. Multiplanar CT image reconstructions of the cervical spine were also generated. COMPARISON:  03/17/2019 FINDINGS: CT HEAD FINDINGS Brain: There again noted changes consistent with prior infarcts in the right cerebellar hemisphere, left temporoparietal lobe, right parietal lobe and bilateral occipital lobes. No new area of focal hemorrhage or infarction is seen. Vascular: No hyperdense vessel or unexpected calcification. Skull: Normal. Negative for fracture or focal lesion. Sinuses/Orbits: No acute finding. Other: None. CT CERVICAL SPINE FINDINGS Alignment: Stable in appearance from the prior exam with mild anterolisthesis of C3 on C4 and C7 on T1 of a degenerative nature. Skull base and vertebrae: 7 cervical segments are well visualized. Vertebral body  height is well maintained. Multilevel disc space narrowing is seen with osteophytic changes. Facet hypertrophic changes are noted at multiple levels. No findings to suggest acute fracture or acute facet abnormality are seen. Soft tissues and spinal canal: Surrounding soft tissue structures appear within normal limits. Upper chest: Visualized lung apices show mild apical scarring. Other: None IMPRESSION: CT of the head: Chronic changes without acute abnormality. CT of the cervical spine: Degenerative change without acute abnormality. Electronically Signed   By: Inez Catalina M.D.   On: 02/08/2020 21:58   DG Pelvis Portable  Result Date: 02/08/2020 CLINICAL DATA:  Recent fall with pelvic pain, initial encounter EXAM: PORTABLE PELVIS 1-2 VIEWS COMPARISON:  06/25/2017 FINDINGS: Pelvic ring is intact. Degenerative changes of the hip joints are noted bilaterally. Some irregularity in the femoral neck is noted on the left somewhat  suspicious for undisplaced fracture on this single view. Degenerative changes in the lumbar spine are noted. IMPRESSION: Irregularity in the left femoral neck suspicious for fracture. CT may be helpful as clinically indicated. Electronically Signed   By: Inez Catalina M.D.   On: 02/08/2020 21:46   DG Hip Unilat W or Wo Pelvis 2-3 Views Left  Result Date: 02/08/2020 CLINICAL DATA:  Left hip pain following fall, suspicious findings on recent pelvis film, initial encounter EXAM: DG HIP (WITHOUT PELVIS) 2V LEFT COMPARISON:  Pelvis film from earlier in the same day. FINDINGS: Degenerative changes of the hip joint are noted. Some remodeling of the femoral head is noted which accounts for the abnormality seen on the recent plain film of the pelvis. No other focal abnormality is seen. IMPRESSION: Degenerative change without definitive fracture. Electronically Signed   By: Inez Catalina M.D.   On: 02/08/2020 22:34    Procedures Procedures (including critical care time)  Medications Ordered in ED Medications - No data to display  ED Course  I have reviewed the triage vital signs and the nursing notes.  Pertinent labs & imaging results that were available during my care of the patient were reviewed by me and considered in my medical decision making (see chart for details).    MDM Rules/Calculators/A&P                      86 yo F with a chief complaint of a possible fall.  Patient is demented and unable to provide history.  She does have a small abrasion to the right frontal region.  Will obtain a CT of the head and C-spine.  She does have some restriction of internal and external rotation on bilateral lower extremities does not appear to be painful though the patient screams spontaneously when touched in different areas across her entire body.  Will obtain a portable pelvis film.  CT of the head and C-spine are negative.  Plain film of the pelvis was concerning for possible subacute hip fracture.  Further  imaging of the left proximal femur without fracture more likely chronic degenerative findings.  Discharge home.  11:36 PM:  I have discussed the diagnosis/risks/treatment options with the patient and believe the pt to be eligible for discharge home to follow-up with PCP. We also discussed returning to the ED immediately if new or worsening sx occur. We discussed the sx which are most concerning (e.g., sudden worsening pain, fever, inability to tolerate by mouth) that necessitate immediate return. Medications administered to the patient during their visit and any new prescriptions provided to the patient are listed below.  Medications given during  this visit Medications - No data to display   The patient appears reasonably screen and/or stabilized for discharge and I doubt any other medical condition or other [facility] requiring further screening, evaluation, or treatment in the ED at this time prior to discharge.   Final Clinical Impression(s) / ED Diagnoses Final diagnoses:  Fall, initial encounter    Rx / DC Orders ED Discharge Orders    None       Deno Etienne, DO 02/08/20 2336

## 2020-02-08 NOTE — ED Notes (Signed)
Pt transported to radiology.

## 2020-02-08 NOTE — ED Notes (Signed)
Attempted report to [facility], no answer

## 2020-02-09 ENCOUNTER — Encounter (HOSPITAL_COMMUNITY): Payer: Self-pay | Admitting: Radiology
